# Patient Record
Sex: Male | Born: 1935 | Race: White | Hispanic: No | Marital: Married | Smoking: Never smoker
Health system: Southern US, Community
[De-identification: ages and names within clinical notes are randomized; demographics above are authoritative.]

## PROBLEM LIST (undated history)

## (undated) DIAGNOSIS — E079 Disorder of thyroid, unspecified: Secondary | ICD-10-CM

## (undated) DIAGNOSIS — M1990 Unspecified osteoarthritis, unspecified site: Secondary | ICD-10-CM

## (undated) DIAGNOSIS — C801 Malignant (primary) neoplasm, unspecified: Secondary | ICD-10-CM

## (undated) DIAGNOSIS — F0280 Dementia in other diseases classified elsewhere without behavioral disturbance: Secondary | ICD-10-CM

## (undated) DIAGNOSIS — I10 Essential (primary) hypertension: Secondary | ICD-10-CM

## (undated) DIAGNOSIS — G309 Alzheimer's disease, unspecified: Secondary | ICD-10-CM

## (undated) DIAGNOSIS — C44229 Squamous cell carcinoma of skin of left ear and external auricular canal: Secondary | ICD-10-CM

## (undated) DIAGNOSIS — I4891 Unspecified atrial fibrillation: Secondary | ICD-10-CM

## (undated) DIAGNOSIS — E119 Type 2 diabetes mellitus without complications: Secondary | ICD-10-CM

## (undated) HISTORY — PX: HERNIA REPAIR: SHX51

## (undated) HISTORY — PX: JOINT REPLACEMENT: SHX530

## (undated) HISTORY — PX: BRAIN SURGERY: SHX531

---

## 2017-04-28 ENCOUNTER — Encounter: Payer: Self-pay | Admitting: Emergency Medicine

## 2017-04-28 ENCOUNTER — Emergency Department: Payer: Medicare Other

## 2017-04-28 ENCOUNTER — Observation Stay
Admission: EM | Admit: 2017-04-28 | Discharge: 2017-04-29 | Disposition: A | Discharge status: Home Health | Payer: Medicare Other | Attending: Internal Medicine | Admitting: Internal Medicine

## 2017-04-28 ENCOUNTER — Observation Stay
Admit: 2017-04-28 | Discharge: 2017-04-28 | Disposition: A | Discharge status: Home / Self Care | Payer: Medicare Other | Attending: Internal Medicine | Admitting: Internal Medicine

## 2017-04-28 DIAGNOSIS — N183 Chronic kidney disease, stage 3 (moderate): Secondary | ICD-10-CM | POA: Diagnosis not present

## 2017-04-28 DIAGNOSIS — M545 Low back pain: Secondary | ICD-10-CM | POA: Insufficient documentation

## 2017-04-28 DIAGNOSIS — Z66 Do not resuscitate: Secondary | ICD-10-CM | POA: Insufficient documentation

## 2017-04-28 DIAGNOSIS — I480 Paroxysmal atrial fibrillation: Secondary | ICD-10-CM | POA: Diagnosis not present

## 2017-04-28 DIAGNOSIS — Z79899 Other long term (current) drug therapy: Secondary | ICD-10-CM | POA: Diagnosis not present

## 2017-04-28 DIAGNOSIS — G8929 Other chronic pain: Secondary | ICD-10-CM | POA: Diagnosis not present

## 2017-04-28 DIAGNOSIS — R778 Other specified abnormalities of plasma proteins: Secondary | ICD-10-CM

## 2017-04-28 DIAGNOSIS — M1990 Unspecified osteoarthritis, unspecified site: Secondary | ICD-10-CM | POA: Insufficient documentation

## 2017-04-28 DIAGNOSIS — E119 Type 2 diabetes mellitus without complications: Secondary | ICD-10-CM | POA: Diagnosis not present

## 2017-04-28 DIAGNOSIS — E0590 Thyrotoxicosis, unspecified without thyrotoxic crisis or storm: Secondary | ICD-10-CM | POA: Insufficient documentation

## 2017-04-28 DIAGNOSIS — Z91041 Radiographic dye allergy status: Secondary | ICD-10-CM | POA: Diagnosis not present

## 2017-04-28 DIAGNOSIS — Z7901 Long term (current) use of anticoagulants: Secondary | ICD-10-CM | POA: Diagnosis not present

## 2017-04-28 DIAGNOSIS — E039 Hypothyroidism, unspecified: Secondary | ICD-10-CM | POA: Insufficient documentation

## 2017-04-28 DIAGNOSIS — R531 Weakness: Secondary | ICD-10-CM

## 2017-04-28 DIAGNOSIS — I129 Hypertensive chronic kidney disease with stage 1 through stage 4 chronic kidney disease, or unspecified chronic kidney disease: Secondary | ICD-10-CM | POA: Diagnosis not present

## 2017-04-28 DIAGNOSIS — R7989 Other specified abnormal findings of blood chemistry: Secondary | ICD-10-CM | POA: Diagnosis not present

## 2017-04-28 DIAGNOSIS — M549 Dorsalgia, unspecified: Secondary | ICD-10-CM

## 2017-04-28 DIAGNOSIS — F0281 Dementia in other diseases classified elsewhere with behavioral disturbance: Secondary | ICD-10-CM | POA: Insufficient documentation

## 2017-04-28 DIAGNOSIS — I4891 Unspecified atrial fibrillation: Secondary | ICD-10-CM | POA: Diagnosis present

## 2017-04-28 DIAGNOSIS — Z7983 Long term (current) use of bisphosphonates: Secondary | ICD-10-CM | POA: Insufficient documentation

## 2017-04-28 DIAGNOSIS — D696 Thrombocytopenia, unspecified: Secondary | ICD-10-CM | POA: Diagnosis not present

## 2017-04-28 DIAGNOSIS — Z7984 Long term (current) use of oral hypoglycemic drugs: Secondary | ICD-10-CM | POA: Insufficient documentation

## 2017-04-28 DIAGNOSIS — E860 Dehydration: Secondary | ICD-10-CM | POA: Insufficient documentation

## 2017-04-28 DIAGNOSIS — G309 Alzheimer's disease, unspecified: Secondary | ICD-10-CM | POA: Insufficient documentation

## 2017-04-28 DIAGNOSIS — M6282 Rhabdomyolysis: Secondary | ICD-10-CM | POA: Insufficient documentation

## 2017-04-28 DIAGNOSIS — N179 Acute kidney failure, unspecified: Secondary | ICD-10-CM | POA: Diagnosis not present

## 2017-04-28 DIAGNOSIS — M6281 Muscle weakness (generalized): Secondary | ICD-10-CM | POA: Insufficient documentation

## 2017-04-28 HISTORY — DX: Dementia in other diseases classified elsewhere, unspecified severity, without behavioral disturbance, psychotic disturbance, mood disturbance, and anxiety: F02.80

## 2017-04-28 HISTORY — DX: Unspecified osteoarthritis, unspecified site: M19.90

## 2017-04-28 HISTORY — DX: Squamous cell carcinoma of skin of left ear and external auricular canal: C44.229

## 2017-04-28 HISTORY — DX: Disorder of thyroid, unspecified: E07.9

## 2017-04-28 HISTORY — DX: Alzheimer's disease, unspecified: G30.9

## 2017-04-28 HISTORY — DX: Essential (primary) hypertension: I10

## 2017-04-28 HISTORY — DX: Malignant (primary) neoplasm, unspecified: C80.1

## 2017-04-28 HISTORY — DX: Unspecified atrial fibrillation: I48.91

## 2017-04-28 HISTORY — DX: Type 2 diabetes mellitus without complications: E11.9

## 2017-04-28 LAB — URINALYSIS, COMPLETE (UACMP) WITH MICROSCOPIC
Bacteria, UA: NONE SEEN
Bilirubin Urine: NEGATIVE
Glucose, UA: 50 mg/dL — AB
KETONES UR: 5 mg/dL — AB
Leukocytes, UA: NEGATIVE
Nitrite: NEGATIVE
PH: 5 (ref 5.0–8.0)
Protein, ur: 30 mg/dL — AB
SPECIFIC GRAVITY, URINE: 1.023 (ref 1.005–1.030)
Squamous Epithelial / LPF: NONE SEEN

## 2017-04-28 LAB — COMPREHENSIVE METABOLIC PANEL
ALT: 24 U/L (ref 17–63)
ANION GAP: 8 (ref 5–15)
AST: 46 U/L — ABNORMAL HIGH (ref 15–41)
Albumin: 3.7 g/dL (ref 3.5–5.0)
Alkaline Phosphatase: 76 U/L (ref 38–126)
BUN: 27 mg/dL — ABNORMAL HIGH (ref 6–20)
CALCIUM: 9 mg/dL (ref 8.9–10.3)
CO2: 26 mmol/L (ref 22–32)
Chloride: 101 mmol/L (ref 101–111)
Creatinine, Ser: 1.53 mg/dL — ABNORMAL HIGH (ref 0.61–1.24)
GFR calc non Af Amer: 39 mL/min — ABNORMAL LOW (ref >60)
GFR, EST AFRICAN AMERICAN: 46 mL/min — AB (ref >60)
Glucose, Bld: 143 mg/dL — ABNORMAL HIGH (ref 65–99)
Potassium: 3.7 mmol/L (ref 3.5–5.1)
SODIUM: 135 mmol/L (ref 135–145)
Total Bilirubin: 1.4 mg/dL — ABNORMAL HIGH (ref 0.3–1.2)
Total Protein: 6.7 g/dL (ref 6.5–8.1)

## 2017-04-28 LAB — CBC WITH DIFFERENTIAL/PLATELET
Basophils Absolute: 0 10*3/uL (ref 0–0.1)
Basophils Relative: 0 %
EOS ABS: 0 10*3/uL (ref 0–0.7)
EOS PCT: 0 %
HCT: 38.8 % — ABNORMAL LOW (ref 40.0–52.0)
Hemoglobin: 13.6 g/dL (ref 13.0–18.0)
LYMPHS ABS: 2 10*3/uL (ref 1.0–3.6)
Lymphocytes Relative: 20 %
MCH: 32 pg (ref 26.0–34.0)
MCHC: 35 g/dL (ref 32.0–36.0)
MCV: 91.6 fL (ref 80.0–100.0)
MONO ABS: 1 10*3/uL (ref 0.2–1.0)
MONOS PCT: 10 %
Neutro Abs: 7.2 10*3/uL — ABNORMAL HIGH (ref 1.4–6.5)
Neutrophils Relative %: 70 %
PLATELETS: 141 10*3/uL — AB (ref 150–440)
RBC: 4.24 MIL/uL — ABNORMAL LOW (ref 4.40–5.90)
RDW: 13.8 % (ref 11.5–14.5)
WBC: 10.3 10*3/uL (ref 3.8–10.6)

## 2017-04-28 LAB — MAGNESIUM: Magnesium: 1.6 mg/dL — ABNORMAL LOW (ref 1.7–2.4)

## 2017-04-28 LAB — GLUCOSE, CAPILLARY
GLUCOSE-CAPILLARY: 181 mg/dL — AB (ref 65–99)
GLUCOSE-CAPILLARY: 143 mg/dL — AB (ref 65–99)
Glucose-Capillary: 175 mg/dL — ABNORMAL HIGH (ref 65–99)

## 2017-04-28 LAB — ECHOCARDIOGRAM COMPLETE
Height: 68 in
Weight: 2576 oz

## 2017-04-28 LAB — TSH: TSH: 0.141 u[IU]/mL — ABNORMAL LOW (ref 0.350–4.500)

## 2017-04-28 LAB — TROPONIN I
TROPONIN I: 0.05 ng/mL — AB (ref <0.03)
TROPONIN I: 0.05 ng/mL — AB (ref <0.03)
Troponin I: 0.1 ng/mL (ref <0.03)

## 2017-04-28 LAB — CK: CK TOTAL: 854 U/L — AB (ref 49–397)

## 2017-04-28 MED ORDER — OXYCODONE-ACETAMINOPHEN 5-325 MG PO TABS
1.0000 | ORAL_TABLET | Freq: Once | ORAL | Status: AC
  Administered 2017-04-28: 1 via ORAL

## 2017-04-28 MED ORDER — ENOXAPARIN SODIUM 40 MG/0.4ML ~~LOC~~ SOLN: 40.0000 mg | SUBCUTANEOUS | Status: DC

## 2017-04-28 MED ORDER — TRAMADOL HCL 50 MG PO TABS: 50.0000 mg | ORAL_TABLET | Freq: Four times a day (QID) | ORAL | Status: DC | PRN

## 2017-04-28 MED ORDER — LEVOTHYROXINE SODIUM 88 MCG PO TABS: 88.0000 ug | ORAL_TABLET | Freq: Every day | ORAL | Status: DC

## 2017-04-28 MED ORDER — SODIUM CHLORIDE 0.9 % IV SOLN
Freq: Once | INTRAVENOUS | Status: AC
  Administered 2017-04-28: 11:00:00 via INTRAVENOUS

## 2017-04-28 MED ORDER — POTASSIUM CHLORIDE IN NACL 20-0.9 MEQ/L-% IV SOLN
INTRAVENOUS | Status: AC
  Administered 2017-04-28: 13:00:00 via INTRAVENOUS
  Filled 2017-04-28 (×3): qty 1000

## 2017-04-28 MED ORDER — HALOPERIDOL LACTATE 5 MG/ML IJ SOLN
1.0000 mg | Freq: Four times a day (QID) | INTRAMUSCULAR | Status: DC | PRN
  Administered 2017-04-28: 1 mg via INTRAVENOUS
  Filled 2017-04-28: qty 1

## 2017-04-28 MED ORDER — MAGNESIUM SULFATE 2 GM/50ML IV SOLN
2.0000 g | Freq: Once | INTRAVENOUS | Status: AC
  Administered 2017-04-28: 2 g via INTRAVENOUS
  Filled 2017-04-28: qty 50

## 2017-04-28 MED ORDER — METOPROLOL TARTRATE 5 MG/5ML IV SOLN
5.0000 mg | Freq: Once | INTRAVENOUS | Status: AC
Start: 2017-04-28 — End: 2017-04-28
  Administered 2017-04-28: 5 mg via INTRAVENOUS

## 2017-04-28 MED ORDER — RIVASTIGMINE 13.3 MG/24HR TD PT24: 13.3000 mg | MEDICATED_PATCH | Freq: Every day | TRANSDERMAL | Status: DC

## 2017-04-28 MED ORDER — FINASTERIDE 5 MG PO TABS
5.0000 mg | ORAL_TABLET | Freq: Every day | ORAL | Status: DC
  Administered 2017-04-28 – 2017-04-29 (×2): 5 mg via ORAL
  Filled 2017-04-28 (×2): qty 1

## 2017-04-28 MED ORDER — MAGNESIUM OXIDE 400 (241.3 MG) MG PO TABS
400.0000 mg | ORAL_TABLET | Freq: Every day | ORAL | Status: DC
  Administered 2017-04-29: 400 mg via ORAL
  Filled 2017-04-28: qty 1

## 2017-04-28 MED ORDER — ROSUVASTATIN CALCIUM 10 MG PO TABS
10.0000 mg | ORAL_TABLET | Freq: Every day | ORAL | Status: DC
  Administered 2017-04-28: 10 mg via ORAL
  Filled 2017-04-28 (×2): qty 1

## 2017-04-28 MED ORDER — ONDANSETRON HCL 4 MG/2ML IJ SOLN: 4.0000 mg | Freq: Four times a day (QID) | INTRAMUSCULAR | Status: DC | PRN

## 2017-04-28 MED ORDER — DOCUSATE SODIUM 100 MG PO CAPS
100.0000 mg | ORAL_CAPSULE | Freq: Two times a day (BID) | ORAL | Status: DC
  Administered 2017-04-28 – 2017-04-29 (×2): 100 mg via ORAL
  Filled 2017-04-28 (×2): qty 1

## 2017-04-28 MED ORDER — ALBUTEROL SULFATE (2.5 MG/3ML) 0.083% IN NEBU: 2.5000 mg | INHALATION_SOLUTION | RESPIRATORY_TRACT | Status: DC | PRN

## 2017-04-28 MED ORDER — METOPROLOL TARTRATE 50 MG PO TABS
50.0000 mg | ORAL_TABLET | Freq: Once | ORAL | Status: AC
  Administered 2017-04-28: 50 mg via ORAL

## 2017-04-28 MED ORDER — BUSPIRONE HCL 5 MG PO TABS
5.0000 mg | ORAL_TABLET | Freq: Every evening | ORAL | Status: DC
Start: 2017-04-28 — End: 2017-04-29
  Administered 2017-04-28: 5 mg via ORAL
  Filled 2017-04-28: qty 1

## 2017-04-28 MED ORDER — ACETAMINOPHEN 325 MG PO TABS
650.0000 mg | ORAL_TABLET | Freq: Four times a day (QID) | ORAL | Status: DC | PRN
  Administered 2017-04-28: 650 mg via ORAL
  Filled 2017-04-28: qty 2

## 2017-04-28 MED ORDER — INSULIN ASPART 100 UNIT/ML ~~LOC~~ SOLN
0.0000 [IU] | Freq: Three times a day (TID) | SUBCUTANEOUS | Status: DC
  Administered 2017-04-28 (×2): 2 [IU] via SUBCUTANEOUS
  Filled 2017-04-28 (×2): qty 1

## 2017-04-28 MED ORDER — ONDANSETRON HCL 4 MG PO TABS: 4.0000 mg | ORAL_TABLET | Freq: Four times a day (QID) | ORAL | Status: DC | PRN

## 2017-04-28 MED ORDER — APIXABAN 5 MG PO TABS
5.0000 mg | ORAL_TABLET | Freq: Two times a day (BID) | ORAL | Status: DC
  Administered 2017-04-28 – 2017-04-29 (×2): 5 mg via ORAL
  Filled 2017-04-28 (×2): qty 1

## 2017-04-28 MED ORDER — ACETAMINOPHEN 650 MG RE SUPP: 650.0000 mg | Freq: Four times a day (QID) | RECTAL | Status: DC | PRN

## 2017-04-28 MED ORDER — ASPIRIN EC 81 MG PO TBEC
81.0000 mg | DELAYED_RELEASE_TABLET | Freq: Every day | ORAL | Status: DC
  Administered 2017-04-28 – 2017-04-29 (×2): 81 mg via ORAL
  Filled 2017-04-28 (×2): qty 1

## 2017-04-28 MED ORDER — METOPROLOL TARTRATE 50 MG PO TABS
50.0000 mg | ORAL_TABLET | Freq: Once | ORAL | Status: AC
  Administered 2017-04-28: 50 mg via ORAL
  Filled 2017-04-28: qty 1

## 2017-04-28 MED ORDER — SODIUM CHLORIDE 0.9% FLUSH
3.0000 mL | Freq: Two times a day (BID) | INTRAVENOUS | Status: DC
  Administered 2017-04-28: 3 mL via INTRAVENOUS

## 2017-04-28 MED ORDER — POLYETHYLENE GLYCOL 3350 17 G PO PACK: 17.0000 g | PACK | Freq: Every day | ORAL | Status: DC | PRN

## 2017-04-28 MED ORDER — PANTOPRAZOLE SODIUM 40 MG PO TBEC
40.0000 mg | DELAYED_RELEASE_TABLET | ORAL | Status: DC
  Administered 2017-04-28: 40 mg via ORAL
  Filled 2017-04-28 (×2): qty 1

## 2017-04-28 MED ORDER — TAMSULOSIN HCL 0.4 MG PO CAPS
0.4000 mg | ORAL_CAPSULE | Freq: Every day | ORAL | Status: DC
  Administered 2017-04-29: 0.4 mg via ORAL
  Filled 2017-04-28: qty 1

## 2017-04-28 MED ORDER — METOPROLOL TARTRATE 50 MG PO TABS
100.0000 mg | ORAL_TABLET | Freq: Two times a day (BID) | ORAL | Status: DC
  Administered 2017-04-28 – 2017-04-29 (×2): 100 mg via ORAL
  Filled 2017-04-28 (×2): qty 2

## 2017-04-28 MED ORDER — APIXABAN 5 MG PO TABS: 5.0000 mg | ORAL_TABLET | Freq: Once | ORAL | Status: DC

## 2017-04-28 NOTE — Progress Notes (Signed)
Sent Dr. Marcille Blanco a text page to notify of increase of troponin from 0.05 to 0.10.

## 2017-04-28 NOTE — H&P (Addendum)
[facility] [place] NAME: Zachary Serrano    MR#:  [id number]  DATE OF BIRTH:  05-03-1928  DATE OF ADMISSION:  04/28/2017  PRIMARY CARE PHYSICIAN: Kirk Ruths, MD   REQUESTING/REFERRING PHYSICIAN: Dr. Jimmye Norman  CHIEF COMPLAINT:   Chief Complaint  Patient presents with  . Back Pain    HISTORY OF PRESENT ILLNESS:  Zachary Serrano  is a 83 y.o. male with a known history of Hypothyroidism, atrial fibrillation, hypertension, Alzheimer's presents to the emergency room brought in by his wife and son from independent living facility due to weakness and unable to walk. Patient at baseline ambulates independently with a walker. Does not need help getting off from bed or chair. Today patient went to the toilet and sat down. Could not get up felt extremely weak and was brought to the ER. Here he was found to have atrial fibrillation with rapid ventricular rate into the 120s. Was given 1 dose of IV Lopressor and he converted to normal sinus rhythm with heart rate in the 80s. He does take metoprolol at home which she did not take in the morning. Patient has Alzheimer's disease diagnosed 5 years back which seems to be progressively worsening. Has had poor appetite for the past 2 or 3 days according to the wife. History obtained from wife and son at bedside. Overall patient is a poor historian and denies any complaints.  He is a [place] patient. Does follow with Dr. Ouida Sills locally here.  PAST MEDICAL HISTORY:   Past Medical History:  Diagnosis Date  . A-fib ([place])   . Alzheimer disease   . Arthritis   . Cancer ([place])    skin  . Diabetes mellitus without complication ([place])   . Hypertension   . 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 Thyroid disease     PAST SURGICAL HISTORY:   Past Surgical History:  Procedure Laterality Date  . BRAIN SURGERY    . HERNIA REPAIR    . JOINT REPLACEMENT      SOCIAL HISTORY:    Social History  Substance Use Topics  . Smoking status: Never Smoker  . Smokeless tobacco: Never Used  . Alcohol use No    FAMILY HISTORY:   Family History  Problem Relation Age of Onset  . Colon cancer Neg Hx     DRUG ALLERGIES:   Allergies  Allergen Reactions  . Contrast Media [Iodinated Diagnostic Agents] Other (See Comments)    Dizziness.  . Diphenhydramine     dizzy  . Fenofibrate Other (See Comments)    Anorexia, Fatigue.  Marland Kitchen Lisinopril     cough    REVIEW OF SYSTEMS:   Review of Systems  Constitutional: Positive for malaise/fatigue. Negative for chills, fever and weight loss.  HENT: Negative for hearing loss and nosebleeds.   Eyes: Negative for blurred vision, double vision and pain.  Respiratory: Negative for cough, hemoptysis, sputum production, shortness of breath and wheezing.   Cardiovascular: Negative for chest pain, palpitations, orthopnea and leg swelling.  Gastrointestinal: Negative for abdominal pain, constipation, diarrhea, nausea and vomiting.  Genitourinary: Negative for dysuria and hematuria.  Musculoskeletal: Negative for back pain, falls and myalgias.  Skin: Negative for rash.  Neurological: Positive for weakness. Negative for dizziness, tremors, sensory change, speech change, focal weakness, seizures and headaches.  Endo/Heme/Allergies: Does not bruise/bleed easily.  Psychiatric/Behavioral: Negative for depression and memory loss. The patient is not nervous/anxious.  MEDICATIONS AT HOME:   Prior to Admission medications   Medication Sig Start Date End Date Taking? Authorizing Provider  acetaminophen (TYLENOL) 500 MG tablet Take 1,000 mg by mouth daily as needed.   Yes [provider]  amLODipine (NORVASC) 5 MG tablet Take 5 mg by mouth daily.   Yes [provider]  apixaban (ELIQUIS) 5 MG TABS tablet Take 5 mg by mouth 2 (two) times daily.   Yes [provider]  busPIRone (BUSPAR) 5 MG tablet Take 5 mg by  mouth every evening.   Yes [provider]  Cholecalciferol ([employer] D3) 2000 units CAPS Take 2,000 Units by mouth daily.   Yes [provider]  finasteride (PROSCAR) 5 MG tablet Take 5 mg by mouth daily.   Yes [provider]  ipratropium (ATROVENT) 0.03 % nasal spray Place 2 sprays into both nostrils 3 (three) times daily as needed for rhinitis.   Yes [provider]  ketoconazole (NIZORAL) 2 % cream Apply 1 application topically daily as needed for irritation (Rash on leg).   Yes [provider]  levothyroxine (SYNTHROID, LEVOTHROID) 88 MCG tablet Take 88 mcg by mouth daily before breakfast.   Yes [provider]  MELATONIN PO Take 1 tablet by mouth at bedtime as needed (SLEEP).   Yes [provider]  metFORMIN (GLUCOPHAGE) 500 MG tablet Take 250 mg by mouth 2 (two) times daily with a meal.   Yes [provider]  metoprolol tartrate (LOPRESSOR) 50 MG tablet Take 75 mg by mouth 2 (two) times daily.   Yes [provider]  Multiple Vitamin (MULTIVITAMIN) tablet Take 1 tablet by mouth daily.   Yes [provider]  pantoprazole (PROTONIX) 40 MG tablet Take 40 mg by mouth every other day.   Yes [provider]  predniSONE (DELTASONE) 5 MG tablet Take 5 mg by mouth daily as needed.   Yes [provider]  Rivastigmine 13.3 MG/24HR PT24 Place 13.3 mg onto the skin daily.   Yes [provider]  rosuvastatin (CRESTOR) 10 MG tablet Take 10 mg by mouth daily.   Yes [provider]  tamsulosin (FLOMAX) 0.4 MG CAPS capsule Take 0.4 mg by mouth daily.   Yes [provider]  testosterone cypionate (DEPOTESTOSTERONE CYPIONATE) 200 MG/ML injection Inject 200 mg into the muscle every 28 (twenty-eight) days.   Yes [provider]     VITAL SIGNS:  Blood pressure 139/80, pulse (!) 121, temperature 98.3 F (36.8 C), temperature source Oral, resp. rate 15, height 5\' 8"  (1.727 m),  weight 73 kg (161 lb), SpO2 95 %.  PHYSICAL EXAMINATION:  Physical Exam  GENERAL:  83 y.o.-year-old patient lying in the bed with no acute distress.  EYES: Pupils equal, round, reactive to light and accommodation. No scleral icterus. Extraocular muscles intact.  HEENT: Head atraumatic, normocephalic. Oropharynx and nasopharynx clear. No oropharyngeal erythema, moist oral mucosa  NECK:  Supple, no jugular venous distention. No thyroid enlargement, no tenderness.  LUNGS: Normal breath sounds bilaterally, no wheezing, rales, rhonchi. No use of accessory muscles of respiration.  CARDIOVASCULAR: S1, S2 normal. No murmurs, rubs, or gallops.  ABDOMEN: Soft, nontender, nondistended. Bowel sounds present. No organomegaly or mass.  EXTREMITIES: No pedal edema, cyanosis, or clubbing. + 2 pedal & radial pulses b/l.   NEUROLOGIC: Cranial nerves II through XII are intact. No focal Motor or sensory deficits appreciated b/l PSYCHIATRIC: The patient is alert and awake. Pleasant. SKIN: Left ear skin lesion from recent biopsy  LABORATORY PANEL:   CBC  Recent Labs Lab 04/28/17 0831  WBC 10.3  HGB 13.6  HCT 38.8*  PLT 141*   ------------------------------------------------------------------------------------------------------------------  Chemistries   Recent Labs Lab 04/28/17 0831  NA 135  K 3.7  CL 101  CO2 26  GLUCOSE 143*  BUN 27*  CREATININE 1.53*  CALCIUM 9.0  AST 46*  ALT 24  ALKPHOS 76  BILITOT 1.4*   ------------------------------------------------------------------------------------------------------------------  Cardiac Enzymes  Recent Labs Lab 04/28/17 0831  TROPONINI 0.05*   ------------------------------------------------------------------------------------------------------------------  RADIOLOGY:  Dg Lumbar Spine 2-3 Views  Result Date: 04/28/2017 CLINICAL DATA:  83 year old male with chronic lumbar back pain, prior surgery. Continued pain. EXAM: LUMBAR SPINE  - 2-3 VIEW COMPARISON:  None. FINDINGS: Sequelae of lower lumbar and sacral fusion. Lumbar segmentation appears normal, with previous posterior and interbody fusion at L4-L5 and L5-S1. Transpedicular hardware remains in place. Severe disc space loss with vacuum disc and mild retrolisthesis of L3 on L4. Moderate facet hypertrophy at that level. A degree of underlying osteopenia. No acute osseous abnormality identified. Extensive Calcified aortic atherosclerosis. IMPRESSION: 1. Prior L4-L5 and L5-S1 fusion with solid appearing arthrodesis. 2. 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 3. Extensive Calcified aortic atherosclerosis. Electronically Signed   By: Genevie Ann M.D.   On: 04/28/2017 08:34     IMPRESSION AND PLAN:   * Afib with RVR Improved with Metoprolol and in normal sinus rhythm. Has mildly elevated troponin. Will admit under observation. Check echocardiogram. Repeat troponin. Telemetry monitoring. ON eiliquis.  * Fatigue. Likely due to A. fib with RVR and possibly mild dehydration. We'll have physical therapy evaluate for discharge needs.  * Acute kidney injury versus CKD stage III. Start IV fluids. Repeat labs in the morning. He does seem to be mildly dehydrated per  * Hypothyroidism. Continue levothyroxine. Check TSH.  * Diabetes mellitus. Sliding scale insulin.  * DVT prophylaxis  ON eliquis  All the records are reviewed and case discussed with ED provider. Management plans discussed with the patient, family and they are in agreement.  CODE STATUS: FULL CODE  TOTAL TIME TAKING CARE OF THIS PATIENT: 40 minutes.   Hillary Bow R M.D on 04/28/2017 at 11:03 AM  Between 7am to 6pm - Pager - [phone]  After 6pm go to [email or web address] - password [email or web address] [facility]  Office  [phone]  CC: Primary care physician; Kirk Ruths, MD  Note: This dictation was prepared with Dragon dictation along with  smaller phrase technology. Any transcriptional errors that result from this process are unintentional.

## 2017-04-28 NOTE — Clinical Social Work Note (Signed)
CSW received referral for SNF.  Patient was seen by PT and they are recommending home health.  CSW to monitor patient in case his needs change and requires SNF placement.  Jones Broom. [place], MSW, [place]

## 2017-04-28 NOTE — Progress Notes (Signed)
MD notified of low magnesium. I will continue to assess.

## 2017-04-28 NOTE — ED Provider Notes (Signed)
[facility] Emergency Department Provider Note       Time seen: ----------------------------------------- 7:57 AM on 04/28/2017 -----------------------------------------     I have reviewed the triage vital signs and the nursing notes.   HISTORY   Chief Complaint Back Pain    HPI Zachary Serrano is a 83 y.o. male who presents to the ED for low back pain this morning and inability to walk. Patient does have chronic low back pain, has had a history of back surgery with rod placement years ago. His only known medical problems or chronic back pain and atrial fibrillation. He has not had his medications at this morning. Family reports he went to use the bathroom and could not get off the toilet. Since that period time he has not been able to walk, does have a history of dementia.   Past Medical History:  Diagnosis Date  . Arthritis   . Cancer ([place])    skin  . Diabetes mellitus without complication ([place])   . Hypertension   . Thyroid disease     There are no active problems to display for this patient.   Past Surgical History:  Procedure Laterality Date  . BRAIN SURGERY    . HERNIA REPAIR    . JOINT REPLACEMENT      Allergies Patient has no known allergies.  Social History Social History  Substance Use Topics  . Smoking status: Never Smoker  . Smokeless tobacco: Never Used  . Alcohol use No    Review of Systems Constitutional: Negative for fever. Cardiovascular: Negative for chest pain. Respiratory: Negative for shortness of breath. Gastrointestinal: Negative for abdominal pain, vomiting and diarrhea. Genitourinary: Negative for dysuria. Musculoskeletal: Positive for back pain Skin: Negative for rash. Neurological: Negative for headaches, focal weakness or numbness.  All systems negative/normal/unremarkable except as stated in the HPI  ____________________________________________   PHYSICAL EXAM:  VITAL SIGNS: ED Triage Vitals   Enc Vitals Group     BP 04/28/17 0746 139/80     Pulse Rate 04/28/17 0746 (!) 121     Resp 04/28/17 0746 15     Temp 04/28/17 0746 98.3 F (36.8 C)     Temp Source 04/28/17 0746 Oral     SpO2 04/28/17 0746 95 %     Weight 04/28/17 0747 161 lb (73 kg)     Height 04/28/17 0747 5\' 8"  (1.727 m)     Head Circumference --      Peak Flow --      Pain Score --      Pain Loc --      Pain Edu? --      Excl. in [place]? --     Constitutional: Alert,  Well appearing and in no distress. Eyes: Conjunctivae are normal. Normal extraocular movements. ENT   Head: Normocephalic and atraumatic.   Nose: No congestion/rhinnorhea.   Mouth/Throat: Mucous membranes are moist.   Neck: No stridor. Cardiovascular: Normal rate, regular rhythm. No murmurs, rubs, or gallops. Respiratory: Normal respiratory effort without tachypnea nor retractions. Breath sounds are clear and equal bilaterally. No wheezes/rales/rhonchi. Gastrointestinal: Soft and nontender. Normal bowel sounds Musculoskeletal: Nontender with normal range of motion in extremities. No lower extremity tenderness nor edema. Adequate strength in lower extremities Neurologic:  Normal speech and language. No gross focal neurologic deficits are appreciated.  Skin:  Skin is warm, dry and intact. No rash noted. Psychiatric: Mood and affect are normal. Speech and behavior are normal.  ____________________________________________  EKG: Interpreted by me. H  for ablation with a rapid ventricular response, rate is 139 bpm, normal QRS, long QT, possible septal infarct age indeterminate  ____________________________________________  ED COURSE:  Pertinent labs & imaging results that were available during my care of the patient were reviewed by me and considered in my medical decision making (see chart for details). Patient presents for weakness, we will assess with labs and imaging as indicated.    Procedures ____________________________________________   LABS (pertinent positives/negatives)  Labs Reviewed  CBC WITH DIFFERENTIAL/PLATELET - Abnormal; Notable for the following:       Result Value   RBC 4.24 (*)    HCT 38.8 (*)    Platelets 141 (*)    Neutro Abs 7.2 (*)    All other components within normal limits  COMPREHENSIVE METABOLIC PANEL - Abnormal; Notable for the following:    Glucose, Bld 143 (*)    BUN 27 (*)    Creatinine, Ser 1.53 (*)    AST 46 (*)    Total Bilirubin 1.4 (*)    GFR calc non Af Amer 39 (*)    GFR calc Af Amer 46 (*)    All other components within normal limits  TROPONIN I - Abnormal; Notable for the following:    Troponin I 0.05 (*)    All other components within normal limits  URINALYSIS, COMPLETE (UACMP) WITH MICROSCOPIC - Abnormal; Notable for the following:    Color, Urine AMBER (*)    APPearance CLEAR (*)    Glucose, UA 50 (*)    Hgb urine dipstick SMALL (*)    Ketones, ur 5 (*)    Protein, ur 30 (*)    All other components within normal limits  CBG MONITORING, ED    RADIOLOGY  Lumbar spine x-rays IMPRESSION: 1. Prior L4-L5 and L5-S1 fusion with solid appearing arthrodesis. 2. 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 3. Extensive Calcified aortic atherosclerosis.  ____________________________________________  FINAL ASSESSMENT AND PLAN  Weakness, atrial fibrillation, elevated troponin  Plan: Patient's labs and imaging were dictated above. Patient had presented for weakness, found to be in rapid atrial fibrillation. Patient was given IV and oral metoprolol for rate control. Currently he is back in a sinus rhythm. Family states he is so weak that he cannot walk, he would benefit from observation in the hospital, repeat troponin and PT consult with social work.   Earleen Newport, MD   Note: This note was generated in part or whole with voice recognition software. Voice  recognition is usually quite accurate but there are transcription errors that can and very often do occur. I apologize for any typographical errors that were not detected and corrected.     Earleen Newport, MD 04/28/17 1019

## 2017-04-28 NOTE — Plan of Care (Signed)
Problem: Safety: Goal: Ability to remain free from injury will improve Outcome: Progressing Pt placed on high fall risk secondary to history of dementia. Exit alarm activated and pt placed in front of nurses station to help decrease risk of fall.

## 2017-04-28 NOTE — Progress Notes (Signed)
Pts  Family leave around 29.  RN enter to give bedside report 2 min. Later and  Pt found in the floor unwitnessed fall. VSS, no complaints of pain, assessed for injury none noted. Pt was sitting beside bed with legs folded beside him. MD notified, family notified.

## 2017-04-28 NOTE — Care Management Obs Status (Signed)
[place] NOTIFICATION   Patient Details  Name: Zachary Serrano MRN: [id number] Date of Birth: 04/07/1928   Medicare Observation Status Notification Given:  Yes    Katrina Stack, RN 04/28/2017, 2:54 PM

## 2017-04-28 NOTE — ED Notes (Signed)
Coffee given to wife

## 2017-04-28 NOTE — Progress Notes (Signed)
MD notified of patient fall. MD orders haldol prn. I will continue to assess.

## 2017-04-28 NOTE — Evaluation (Signed)
Physical Therapy Evaluation Patient Details Name: Zachary Serrano MRN: [id number] DOB: [birth year] Today's Date: 04/28/2017   History of Present Illness  83 y.o. male with a known history of Hypothyroidism, atrial fibrillation, hypertension, Alzheimer's presents to the emergency room brought in by his wife and son from independent living facility due to weakness and unable to walk.   Clinical Impression  Pt is able to rise to sitting w/o direct assist though he does need heavy UE use and some momentum.  Overall he did well with good confidence during ambulation and used FWW well, though apparently he walks significantly faster and smoother with 4WW at baseline.  Pt showed functional strength and should be able to return home with HHPT.     Follow Up Recommendations Home health PT    Equipment Recommendations       Recommendations for Other Services       Precautions / Restrictions Precautions Precautions: Fall Restrictions Weight Bearing Restrictions: No      Mobility  Bed Mobility Overal bed mobility: Modified Independent             General bed mobility comments: Pt struggled to get himself to EOB, but ultimately needed only CGA  Transfers Overall transfer level: Modified independent Equipment used: Rolling walker (2 wheeled)             General transfer comment: pt needed reminders for hand placement and general safety issues, but ultimately rose to standing without direct assist  Ambulation/Gait Ambulation/Gait assistance: Supervision Ambulation Distance (Feet): 200 Feet Assistive device: Rolling walker (2 wheeled)       General Gait Details: Pt showed good confidence and consistent cadence with the effort of ambulation.  He was not at his baseline (speed, confidence) but ultimately is safe for in-home distances.  Pt's O2 remained in the 90s and HR stayed <110 with the effort of prolonged ambulation.   Stairs            Wheelchair Mobility     Modified Rankin (Stroke Patients Only)       Balance Overall balance assessment: Modified Independent                                           Pertinent Vitals/Pain Pain Assessment: No/denies pain    Home Living Family/patient expects to be discharged to:: Private residence Living Arrangements: Spouse/significant other Available Help at Discharge:  (son lives close, highly involved)   Home Access: Level entry       Home Equipment: Walker - 4 wheels      Prior Function Level of Independence: Independent with assistive device(s)               Hand Dominance        Extremity/Trunk Assessment   Upper Extremity Assessment Upper Extremity Assessment: Generalized weakness;Overall [facility] for tasks assessed    Lower Extremity Assessment Lower Extremity Assessment: Generalized weakness;Overall WFL for tasks assessed       Communication   Communication: No difficulties  Cognition Arousal/Alertness: Awake/alert Behavior During Therapy: WFL for tasks assessed/performed Overall Cognitive Status: History of cognitive impairments - at baseline (son reports he is back to his baseline)  General Comments      Exercises     Assessment/Plan    PT Assessment Patient needs continued PT services  PT Problem List         PT Treatment Interventions DME instruction;Gait training;Functional mobility training;Therapeutic activities;Therapeutic exercise;Balance training;Neuromuscular re-education;Patient/family education    PT Goals (Current goals can be found in the Care Plan section)  Acute Rehab PT Goals Patient Stated Goal: go home PT Goal Formulation: With patient Time For Goal Achievement: 05/12/17 Potential to Achieve Goals: Good    Frequency Min 2X/week   Barriers to discharge        Co-evaluation               AM-PAC PT "6 Clicks" Daily Activity  Outcome Measure  Difficulty turning over in bed (including adjusting bedclothes, sheets and blankets)?: A Little Difficulty moving from lying on back to sitting on the side of the bed? : A Little Difficulty sitting down on and standing up from a chair with arms (e.g., wheelchair, bedside commode, etc,.)?: A Little Help needed moving to and from a bed to chair (including a wheelchair)?: None Help needed walking in hospital room?: None Help needed climbing 3-5 steps with a railing? : A Little 6 Click Score: 20    End of Session Equipment Utilized During Treatment: Gait belt Activity Tolerance: Patient tolerated treatment well Patient left: with chair alarm set;with call bell/phone within reach   PT Visit Diagnosis: Difficulty in walking, not elsewhere classified (R26.2);Muscle weakness (generalized) (M62.81)    Time: 1345-1410 PT Time Calculation (min) (ACUTE ONLY): 25 min   Charges:   PT Evaluation $PT Eval Low Complexity: 1 Procedure     PT G Codes:   PT G-Codes **NOT FOR INPATIENT CLASS** Functional Assessment Tool Used: AM-PAC 6 Clicks Basic Mobility Functional Limitation: Mobility: Walking and moving around Mobility: Walking and Moving Around Current Status (Q2863): At least 20 percent but less than 40 percent impaired, limited or restricted Mobility: Walking and Moving Around Goal Status [phone]): At least 1 percent but less than 20 percent impaired, limited or restricted    Kreg Shropshire, DPT 04/28/2017, 3:34 PM

## 2017-04-28 NOTE — Consult Note (Signed)
[facility]  CARDIOLOGY CONSULT NOTE  Patient ID: Zachary Serrano MRN: [id number] DOB/AGE: 12/12/1927 83 y.o.  Admit date: 04/28/2017 Referring Physician Dr. Darvin Neighbours Primary Physician   Primary Cardiologist   Reason for Consultation afib  HPI: Pt is a 83 yo male with history of dementia and history of previous afib in the past who was admitted after developing increasing weakness and fatigue. He had similar symptoms when he has been in afib in the past. He most recently had afib aproximately 5 years ago during an episode of pna. He was seen in the her and has converted back to nsr with iv metoprolol. He is anticoagulated with apixiban 5 mg bid and on metoprolol 75 mg bid . His symptoms have improved with conversion to nsr. He has ruled ot for an mi and denies current symptoms.  Review of Systems  HENT: Negative.   Eyes: Negative.   Respiratory: Negative.   Cardiovascular: Negative.   Gastrointestinal: Negative.   Genitourinary: Negative.   Musculoskeletal: Negative.   Skin: Negative.   Neurological: Positive for weakness.  Endo/Heme/Allergies: Negative.   Psychiatric/Behavioral: Positive for memory loss.    Past Medical History:  Diagnosis Date  . A-fib ([place])   . Alzheimer disease   . Arthritis   . Cancer ([place])    skin  . Diabetes mellitus without complication ([place])   . Hypertension   . 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 Thyroid disease     Family History  Problem Relation Age of Onset  . Colon cancer Neg Hx     Social History   Social History  . Marital status: Married    Spouse name: N/A  . Number of children: N/A  . Years of education: N/A   Occupational History  . Not on file.   Social History Main Topics  . Smoking status: Never Smoker  . Smokeless tobacco: Never Used  . Alcohol use No  . Drug use: No  . Sexual activity: Not on file   Other Topics Concern  . Not on  file   Social History Narrative  . No narrative on file    Past Surgical History:  Procedure Laterality Date  . BRAIN SURGERY    . HERNIA REPAIR    . JOINT REPLACEMENT       Prescriptions Prior to Admission  Medication Sig Dispense Refill Last Dose  . acetaminophen (TYLENOL) 500 MG tablet Take 1,000 mg by mouth daily as needed.   PRN at PRN  . amLODipine (NORVASC) 5 MG tablet Take 5 mg by mouth daily.   04/28/2017 at 0800  . apixaban (ELIQUIS) 5 MG TABS tablet Take 5 mg by mouth 2 (two) times daily.   04/28/2017 at 0800  . busPIRone (BUSPAR) 5 MG tablet Take 5 mg by mouth every evening.   04/27/2017 at 2000  . Cholecalciferol ([employer] D3) 2000 units CAPS Take 2,000 Units by mouth daily.   04/28/2017 at 0800  . finasteride (PROSCAR) 5 MG tablet Take 5 mg by mouth daily.   04/27/2017 at 2000  . ipratropium (ATROVENT) 0.03 % nasal spray Place 2 sprays into both nostrils 3 (three) times daily as needed for rhinitis.   PRN at PRN  . ketoconazole (NIZORAL) 2 % cream Apply 1 application topically daily as needed for irritation (Rash on leg).   PRN at PRN  . levothyroxine (SYNTHROID, LEVOTHROID) 88 MCG tablet Take 88 mcg  by mouth daily before breakfast.   04/28/2017 at 0800  . MELATONIN PO Take 1 tablet by mouth at bedtime as needed (SLEEP).   PRN at PRN  . metFORMIN (GLUCOPHAGE) 500 MG tablet Take 250 mg by mouth 2 (two) times daily with a meal.   04/28/2017 at 0800  . metoprolol tartrate (LOPRESSOR) 50 MG tablet Take 75 mg by mouth 2 (two) times daily.   04/28/2017 at 0800  . Multiple Vitamin (MULTIVITAMIN) tablet Take 1 tablet by mouth daily.   04/28/2017 at 0800  . pantoprazole (PROTONIX) 40 MG tablet Take 40 mg by mouth every other day.   Past Week at Unknown time  . predniSONE (DELTASONE) 5 MG tablet Take 5 mg by mouth daily as needed.   PRN at PRN  . Rivastigmine 13.3 MG/24HR PT24 Place 13.3 mg onto the skin daily.   Past Week at Unknown time  . rosuvastatin (CRESTOR) 10 MG tablet Take 10 mg by mouth  daily.   04/27/2017 at 2000  . tamsulosin (FLOMAX) 0.4 MG CAPS capsule Take 0.4 mg by mouth daily.   04/28/2017 at 0800  . testosterone cypionate (DEPOTESTOSTERONE CYPIONATE) 200 MG/ML injection Inject 200 mg into the muscle every 28 (twenty-eight) days.   Past Month at Unknown time    Physical Exam: Blood pressure 101/67, pulse (!) 103, temperature 98.2 F (36.8 C), resp. rate 18, height 5\' 11"  (1.803 m), weight 70.1 kg (154 lb 9.6 oz), SpO2 93 %.   Wt Readings from Last 1 Encounters:  04/28/17 70.1 kg (154 lb 9.6 oz)     General appearance: alert and cooperative Resp: clear to auscultation bilaterally Cardio: regular rate and rhythm GI: soft, non-tender; bowel sounds normal; no masses,  no organomegaly Extremities: extremities normal, atraumatic, no cyanosis or edema Neurologic: Grossly normal  Labs:   Lab Results  Component Value Date   WBC 10.3 04/28/2017   HGB 13.6 04/28/2017   HCT 38.8 (L) 04/28/2017   MCV 91.6 04/28/2017   PLT 141 (L) 04/28/2017    Recent Labs Lab 04/28/17 0831  NA 135  K 3.7  CL 101  CO2 26  BUN 27*  CREATININE 1.53*  CALCIUM 9.0  PROT 6.7  BILITOT 1.4*  ALKPHOS 76  ALT 24  AST 46*  GLUCOSE 143*   Lab Results  Component Value Date   CKTOTAL 854 (H) 04/28/2017   TROPONINI 0.05 (HH) 04/28/2017       EKG: afib converted to nsr.  ASSESSMENT AND PLAN:  Pt with history of afib in the past that was symptomatic who has been anticoagulated with eliquis 5 bid and rate controlled with metoprolol 75 mg bid. Will continue with eliquis at current dose and ok to try and increase the metoprolol to 100 bid. Consider discharge in am istable.  Signed: Teodoro Spray MD, [facility] 04/28/2017, 7:18 PM

## 2017-04-28 NOTE — ED Triage Notes (Addendum)
Pt to ed  Via ems from home independent living facility [facility] with reports of low back pain this am. Pt with hx of same and ems reports rods in back. Pt only known hx is chronic back pain and afib, DM. Ems reports all VSS. Pt only alert to self and place. NAD.  Wife reports pt did not take his meds this am and does have dementia.

## 2017-04-28 NOTE — Progress Notes (Signed)
*  PRELIMINARY RESULTS* Echocardiogram 2D Echocardiogram has been performed.  Zachary Serrano 04/28/2017, 2:22 PM

## 2017-04-29 LAB — URINE CULTURE
CULTURE: NO GROWTH
Special Requests: NORMAL

## 2017-04-29 LAB — CK: Total CK: 544 U/L — ABNORMAL HIGH (ref 49–397)

## 2017-04-29 LAB — CBC
HCT: 37.7 % — ABNORMAL LOW (ref 40.0–52.0)
HEMOGLOBIN: 13 g/dL (ref 13.0–18.0)
MCH: 31.6 pg (ref 26.0–34.0)
MCHC: 34.3 g/dL (ref 32.0–36.0)
MCV: 91.9 fL (ref 80.0–100.0)
PLATELETS: 134 10*3/uL — AB (ref 150–440)
RBC: 4.11 MIL/uL — ABNORMAL LOW (ref 4.40–5.90)
RDW: 14.1 % (ref 11.5–14.5)
WBC: 6.8 10*3/uL (ref 3.8–10.6)

## 2017-04-29 LAB — GLUCOSE, CAPILLARY
GLUCOSE-CAPILLARY: 165 mg/dL — AB (ref 65–99)
Glucose-Capillary: 116 mg/dL — ABNORMAL HIGH (ref 65–99)

## 2017-04-29 LAB — BASIC METABOLIC PANEL
ANION GAP: 5 (ref 5–15)
BUN: 20 mg/dL (ref 6–20)
CALCIUM: 8.4 mg/dL — AB (ref 8.9–10.3)
CO2: 27 mmol/L (ref 22–32)
CREATININE: 1.04 mg/dL (ref 0.61–1.24)
Chloride: 106 mmol/L (ref 101–111)
GFR calc Af Amer: >60 mL/min (ref >60)
Glucose, Bld: 136 mg/dL — ABNORMAL HIGH (ref 65–99)
Potassium: 3.7 mmol/L (ref 3.5–5.1)
Sodium: 138 mmol/L (ref 135–145)

## 2017-04-29 LAB — HEMOGLOBIN A1C
Hgb A1c MFr Bld: 7.7 % — ABNORMAL HIGH (ref 4.8–5.6)
Mean Plasma Glucose: 174 mg/dL

## 2017-04-29 MED ORDER — LEVOTHYROXINE SODIUM 50 MCG PO TABS: 50.0000 ug | ORAL_TABLET | Freq: Every day | ORAL | 0 refills | Status: DC

## 2017-04-29 MED ORDER — MAGNESIUM OXIDE 400 (241.3 MG) MG PO TABS: 400.0000 mg | ORAL_TABLET | Freq: Every day | ORAL | 0 refills | Status: AC

## 2017-04-29 MED ORDER — LEVOTHYROXINE SODIUM 50 MCG PO TABS
50.0000 ug | ORAL_TABLET | Freq: Every day | ORAL | Status: DC
  Administered 2017-04-29: 50 ug via ORAL
  Filled 2017-04-29: qty 1

## 2017-04-29 MED ORDER — QUETIAPINE FUMARATE 25 MG PO TABS: 12.5000 mg | ORAL_TABLET | Freq: Every day | ORAL | 0 refills | Status: AC

## 2017-04-29 MED ORDER — QUETIAPINE FUMARATE 25 MG PO TABS
12.5000 mg | ORAL_TABLET | Freq: Every day | ORAL | Status: DC
Start: 2017-04-29 — End: 2017-04-29

## 2017-04-29 MED ORDER — METOPROLOL TARTRATE 100 MG PO TABS: 100.0000 mg | ORAL_TABLET | Freq: Two times a day (BID) | ORAL | 0 refills | Status: AC

## 2017-04-29 NOTE — Care Management (Signed)
Patient placed in observation for atrial fib.  Resident of [facility] .  Have set up home health nurse, physical therapy and aide with [facility] as this agency follows patient's at [facility].

## 2017-04-29 NOTE — Progress Notes (Signed)
A&O with periods of confusion today.  No respiratory distress with o2 sats in 90's on room air even after ambulating with PT.  Tolerated diet. Discharge instructions given to and reviewed with patient's wife, daughter and son.  Prescriptions given to patient's daughter and changes in medications discussed with family.  Family verbalized understanding of all discharge instructions.  Patient left 2A by wheelchair with volunteer and family with no distress noted.

## 2017-04-29 NOTE — Discharge Summary (Signed)
[place] at [place] NAME: Zachary Serrano    MR#:  [id number]  DATE OF BIRTH:  1928-10-18  DATE OF ADMISSION:  04/28/2017 ADMITTING PHYSICIAN: Hillary Bow, MD  DATE OF DISCHARGE: 04/29/2017  1:27 PM  PRIMARY CARE PHYSICIAN: Kirk Ruths, MD    ADMISSION DIAGNOSIS:  Paroxysmal atrial fibrillation ([place]) [I48.0] Weakness [R53.1] Elevated troponin I level [R74.8] Other chronic back pain [M54.9, G89.29]  DISCHARGE DIAGNOSIS:  Paroxysmal atrial fibrillation  HOSPITAL COURSE:   1.  Atrial fibrillation with rapid ventricular response. Patient converted over to normal sinus rhythm. I will classify this as paroxysmal atrial fibrillation. Metoprolol increased 100 mg twice a day. Patient already on Eliquis for anticoagulation. Must weigh benefits and risks of Eliquis on a patient with dementia and fall risk. 2. Dehydration and acute kidney injury. Patient given IV fluid hydration overnight and creatinine had improved from 1.53 down to 1.04. 3. Hypomagnesemia this has improved with IV fluid hydration 4. Mild rhabdomyolysis with a CPK of 854. This has improved with IV fluid hydration down to 544. Discontinue Crestor. 5. Hypothyroidism unspecified with TSH in the hyperthyroid range. Decrease Synthroid down to 50 g daily 6. Weakness. Physical therapy recommended home with home health 7. Dementia with behavioral disturbance. Trial of low-dose Seroquel at night 8. Type 2 diabetes mellitus on Glucophage 9. Essential hypertension continue metoprolol increased dose 10. Elevated troponin secondary to demand ischemia from rapid heart rate 11. Thrombocytopenia. follow-up as outpatient.  DISCHARGE CONDITIONS:   Fair  CONSULTS OBTAINED:  Treatment Team:  Teodoro Spray, MD  DRUG ALLERGIES:   Allergies  Allergen Reactions  . Contrast Media [Iodinated Diagnostic Agents] Other (See Comments)    Dizziness.  . Diphenhydramine     dizzy  .  Fenofibrate Other (See Comments)    Anorexia, Fatigue.  Marland Kitchen Lisinopril     cough    DISCHARGE MEDICATIONS:   Discharge Medication List as of 04/29/2017 12:27 PM    START taking these medications   Details  magnesium oxide (MAG-OX) 400 (241.3 Mg) MG tablet Take 1 tablet (400 mg total) by mouth daily., Starting Sat 04/30/2017, Print    QUEtiapine (SEROQUEL) 25 MG tablet Take 0.5 tablets (12.5 mg total) by mouth at bedtime., Starting Fri 04/29/2017, Print      CONTINUE these medications which have CHANGED   Details  levothyroxine (SYNTHROID, LEVOTHROID) 50 MCG tablet Take 1 tablet (50 mcg total) by mouth daily before breakfast., Starting Sat 04/30/2017, Print    metoprolol tartrate (LOPRESSOR) 100 MG tablet Take 1 tablet (100 mg total) by mouth 2 (two) times daily., Starting Fri 04/29/2017, Print      CONTINUE these medications which have NOT CHANGED   Details  acetaminophen (TYLENOL) 500 MG tablet Take 1,000 mg by mouth daily as needed., Historical Med    apixaban (ELIQUIS) 5 MG TABS tablet Take 5 mg by mouth 2 (two) times daily., Historical Med    busPIRone (BUSPAR) 5 MG tablet Take 5 mg by mouth every evening., Historical Med    Cholecalciferol (CVS D3) 2000 units CAPS Take 2,000 Units by mouth daily., Historical Med    finasteride (PROSCAR) 5 MG tablet Take 5 mg by mouth daily., Historical Med    ipratropium (ATROVENT) 0.03 % nasal spray Place 2 sprays into both nostrils 3 (three) times daily as needed for rhinitis., Historical Med    ketoconazole (NIZORAL) 2 % cream Apply 1 application topically daily as needed for irritation (Rash on leg).,  Historical Med    MELATONIN PO Take 1 tablet by mouth at bedtime as needed (SLEEP)., Historical Med    metFORMIN (GLUCOPHAGE) 500 MG tablet Take 250 mg by mouth 2 (two) times daily with a meal., Historical Med    Multiple Vitamin (MULTIVITAMIN) tablet Take 1 tablet by mouth daily., Historical Med    pantoprazole (PROTONIX) 40 MG tablet  Take 40 mg by mouth every other day., Historical Med    Rivastigmine 13.3 MG/24HR PT24 Place 13.3 mg onto the skin daily., Historical Med    tamsulosin (FLOMAX) 0.4 MG CAPS capsule Take 0.4 mg by mouth daily., Historical Med    testosterone cypionate (DEPOTESTOSTERONE CYPIONATE) 200 MG/ML injection Inject 200 mg into the muscle every 28 (twenty-eight) days., Historical Med      STOP taking these medications     amLODipine (NORVASC) 5 MG tablet      predniSONE (DELTASONE) 5 MG tablet      rosuvastatin (CRESTOR) 10 MG tablet          DISCHARGE INSTRUCTIONS:   Follow-up PMD one week  If you experience worsening of your admission symptoms, develop shortness of breath, life threatening emergency, suicidal or homicidal thoughts you must seek medical attention immediately by calling 911 or calling your MD immediately  if symptoms less severe.  You Must read complete instructions/literature along with all the possible adverse reactions/side effects for all the Medicines you take and that have been prescribed to you. Take any new Medicines after you have completely understood and accept all the possible adverse reactions/side effects.   Please note  You were cared for by a hospitalist during your hospital stay. If you have any questions about your discharge medications or the care you received while you were in the hospital after you are discharged, you can call the unit and asked to speak with the hospitalist on call if the hospitalist that took care of you is not available. Once you are discharged, your primary care physician will handle any further medical issues. Please note that NO REFILLS for any discharge medications will be authorized once you are discharged, as it is imperative that you return to your primary care physician (or establish a relationship with a primary care physician if you do not have one) for your aftercare needs so that they can reassess your need for medications and  monitor your lab values.    Today   CHIEF COMPLAINT:   Chief Complaint  Patient presents with  . Back Pain    HISTORY OF PRESENT ILLNESS:  Zachary Serrano  is a 83 y.o. male with a known history of Presented with atrial fibrillation rapid ventricular response and converted over to normal sinus rhythm   VITAL SIGNS:  Blood pressure 138/61, pulse (!) 103, temperature 98.2 F (36.8 C), temperature source Oral, resp. rate 20, height 5\' 11"  (1.803 m), weight 78 kg (172 lb), SpO2 94 %. Prior heart rates have been in the 70s  PHYSICAL EXAMINATION:  GENERAL:  83 y.o.-year-old patient lying in the bed with no acute distress.  EYES: Pupils equal, round, reactive to light and accommodation. No scleral icterus. Extraocular muscles intact.  HEENT: Head atraumatic, normocephalic. Oropharynx and nasopharynx clear.  NECK:  Supple, no jugular venous distention. No thyroid enlargement, no tenderness.  LUNGS: Normal breath sounds bilaterally, no wheezing, rales,rhonchi or crepitation. No use of accessory muscles of respiration.  CARDIOVASCULAR: S1, S2 normal. No murmurs, rubs, or gallops.  ABDOMEN: Soft, non-tender, non-distended. Bowel sounds present. No organomegaly  or mass.  EXTREMITIES: No pedal edema, cyanosis, or clubbing.  NEUROLOGIC: Cranial nerves II through XII are intact. Muscle strength 5/5 in all extremities. Sensation intact. Gait not checked.  PSYCHIATRIC: The patient is alert and answers yes or no questions appropriately.  SKIN: No obvious rash, lesion, or ulcer.   DATA REVIEW:   CBC  Recent Labs Lab 04/29/17 0537  WBC 6.8  HGB 13.0  HCT 37.7*  PLT 134*    Chemistries   Recent Labs Lab 04/28/17 0831 04/29/17 0537  NA 135 138  K 3.7 3.7  CL 101 106  CO2 26 27  GLUCOSE 143* 136*  BUN 27* 20  CREATININE 1.53* 1.04  CALCIUM 9.0 8.4*  MG 1.6*  --   AST 46*  --   ALT 24  --   ALKPHOS 76  --   BILITOT 1.4*  --     Cardiac Enzymes  Recent Labs Lab  04/28/17 2117  TROPONINI 0.10*    Microbiology Results  Results for orders placed or performed during the hospital encounter of 04/28/17  Urine culture     Status: None   Collection Time: 04/28/17  9:32 AM  Result Value Ref Range Status   Specimen Description URINE, CLEAN CATCH  Final   Special Requests Normal  Final   Culture   Final    NO GROWTH Performed at [facility], [place] [address] [place],  [address]    Report Status 04/29/2017 FINAL  Final    RADIOLOGY:  Dg Lumbar Spine 2-3 Views  Result Date: 04/28/2017 CLINICAL DATA:  83 year old male with chronic lumbar back pain, prior surgery. Continued pain. EXAM: LUMBAR SPINE - 2-3 VIEW COMPARISON:  None. FINDINGS: Sequelae of lower lumbar and sacral fusion. Lumbar segmentation appears normal, with previous posterior and interbody fusion at L4-L5 and L5-S1. Transpedicular hardware remains in place. Severe disc space loss with vacuum disc and mild retrolisthesis of L3 on L4. Moderate facet hypertrophy at that level. A degree of underlying osteopenia. No acute osseous abnormality identified. Extensive Calcified aortic atherosclerosis. IMPRESSION: 1. Prior L4-L5 and L5-S1 fusion with solid appearing arthrodesis. 2. 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 3. Extensive Calcified aortic atherosclerosis. Electronically Signed   By: Genevie Ann M.D.   On: 04/28/2017 08:34      Management plans discussed with the patient, family and they are in agreement.  CODE STATUS:     Code Status Orders        Start     Ordered   04/28/17 1103  Do not attempt resuscitation (DNR)  Continuous    Question Answer Comment  In the event of cardiac or respiratory ARREST Do not call a "code blue"   In the event of cardiac or respiratory ARREST Do not perform Intubation, CPR, defibrillation or ACLS   In the event of cardiac or respiratory ARREST Use medication by any route, position, wound care, and  other measures to relive pain and suffering. May use oxygen, suction and manual treatment of airway obstruction as needed for comfort.      04/28/17 1102    Code Status History    Date Active Date Inactive Code Status Order ID Comments User Context   04/28/2017 10:30 AM 04/28/2017 11:02 AM Full Code [id number]  Hillary Bow, MD ED    Advance Directive Documentation     Most Recent Value  Type of Advance Directive  Healthcare Power of Attorney  Pre-existing out of facility  DNR order (yellow form or pink MOST form)  -  "MOST" Form in Place?  -      TOTAL TIME TAKING CARE OF THIS PATIENT: 35 minutes.    [name] M.D on 04/29/2017 at 4:21 PM  Between 7am to 6pm - Pager - [phone]  After 6pm go to [email or web address] - password [email or web address]  [facility] Office  [phone]  CC: Primary care physician; Kirk Ruths, MD

## 2017-04-29 NOTE — Progress Notes (Signed)
Medical record request form faxed per family's request.

## 2017-04-29 NOTE — Progress Notes (Signed)
Physical Therapy Treatment Patient Details Name: Zachary Serrano MRN: [id number] DOB: 1928-09-18 Today's Date: 04/29/2017    History of Present Illness 83 y.o. male with a known history of Hypothyroidism, atrial fibrillation, hypertension, Alzheimer's presents to the emergency room brought in by his wife and son from independent living facility due to weakness and unable to walk.     PT Comments    Pt did well with ambulation again today and was able to maintain consistent speed and cadence though he did need consistent cuing secondary to confusion.  He did not have any overt LOBs but needed a lot of cuing to insure safety/awareness.  Overall pt is a little weaker than baseline and does need supervision but should be able to return to independent living facility with assist from family and HHPT.    Follow Up Recommendations  Home health PT     Equipment Recommendations       Recommendations for Other Services       Precautions / Restrictions Precautions Precautions: Fall Restrictions Weight Bearing Restrictions: No    Mobility  Bed Mobility Overal bed mobility: Modified Independent             General bed mobility comments: Pt again needed some extra time and effort to get to sitting at EOB, confused with scooting in bed but phyiscally able to do so.  Transfers Overall transfer level: Modified independent Equipment used: Rolling walker (2 wheeled)             General transfer comment: VCs for hand placement, sequencing but able to rise to standing from low height w/o phyiscal assist  Ambulation/Gait Ambulation/Gait assistance: Supervision Ambulation Distance (Feet): 200 Feet Assistive device: Rolling walker (2 wheeled)       General Gait Details: Pt again able to circumambulate the nurses' station w/o assist safety concerns though he did need consistent cuing for directions and to stay on task.  PT with some minimal fatigue during the effort but O2 stayed in  the 90s on room air and ultimately he was not too far from his baseline   Stairs            Wheelchair Mobility    Modified Rankin (Stroke Patients Only)       Balance Overall balance assessment: Modified Independent (pt's balance issues stem from confusion/mental status)                                          Cognition Arousal/Alertness: Awake/alert Behavior During Therapy: Impulsive;WFL for tasks assessed/performed Overall Cognitive Status: History of cognitive impairments - at baseline                                        Exercises      General Comments        Pertinent Vitals/Pain Pain Assessment: No/denies pain    Home Living                      Prior Function            PT Goals (current goals can now be found in the care plan section) Progress towards PT goals: Progressing toward goals    Frequency    Min 2X/week      PT Plan Current plan remains  appropriate    Co-evaluation              AM-PAC PT "6 Clicks" Daily Activity  Outcome Measure  Difficulty turning over in bed (including adjusting bedclothes, sheets and blankets)?: A Little Difficulty moving from lying on back to sitting on the side of the bed? : A Little Difficulty sitting down on and standing up from a chair with arms (e.g., wheelchair, bedside commode, etc,.)?: A Little Help needed moving to and from a bed to chair (including a wheelchair)?: None Help needed walking in hospital room?: None Help needed climbing 3-5 steps with a railing? : A Little 6 Click Score: 20    End of Session Equipment Utilized During Treatment: Gait belt Activity Tolerance: Patient tolerated treatment well Patient left: with chair alarm set;with call bell/phone within reach   PT Visit Diagnosis: Difficulty in walking, not elsewhere classified (R26.2);Muscle weakness (generalized) (M62.81)     Time: 6237-6283 PT Time Calculation (min) (ACUTE  ONLY): 17 min  Charges:  $Gait Training: 8-22 mins                    G Codes:       Kreg Shropshire, DPT 04/29/2017, 11:57 AM

## 2018-04-15 ENCOUNTER — Other Ambulatory Visit: Payer: Self-pay

## 2018-04-15 ENCOUNTER — Encounter: Payer: Self-pay | Admitting: Emergency Medicine

## 2018-04-15 ENCOUNTER — Observation Stay
Admission: EM | Admit: 2018-04-15 | Discharge: 2018-04-19 | Disposition: A | Discharge status: Home Health | Payer: Medicare Other | Attending: Specialist | Admitting: Specialist

## 2018-04-15 ENCOUNTER — Emergency Department: Payer: Medicare Other

## 2018-04-15 DIAGNOSIS — Z7984 Long term (current) use of oral hypoglycemic drugs: Secondary | ICD-10-CM | POA: Insufficient documentation

## 2018-04-15 DIAGNOSIS — Z888 Allergy status to other drugs, medicaments and biological substances status: Secondary | ICD-10-CM | POA: Diagnosis not present

## 2018-04-15 DIAGNOSIS — I10 Essential (primary) hypertension: Secondary | ICD-10-CM | POA: Diagnosis not present

## 2018-04-15 DIAGNOSIS — Z91041 Radiographic dye allergy status: Secondary | ICD-10-CM | POA: Insufficient documentation

## 2018-04-15 DIAGNOSIS — E119 Type 2 diabetes mellitus without complications: Secondary | ICD-10-CM | POA: Diagnosis not present

## 2018-04-15 DIAGNOSIS — Z7901 Long term (current) use of anticoagulants: Secondary | ICD-10-CM | POA: Diagnosis not present

## 2018-04-15 DIAGNOSIS — F0280 Dementia in other diseases classified elsewhere without behavioral disturbance: Secondary | ICD-10-CM | POA: Insufficient documentation

## 2018-04-15 DIAGNOSIS — E079 Disorder of thyroid, unspecified: Secondary | ICD-10-CM | POA: Insufficient documentation

## 2018-04-15 DIAGNOSIS — J189 Pneumonia, unspecified organism: Secondary | ICD-10-CM | POA: Insufficient documentation

## 2018-04-15 DIAGNOSIS — A419 Sepsis, unspecified organism: Principal | ICD-10-CM | POA: Insufficient documentation

## 2018-04-15 DIAGNOSIS — E860 Dehydration: Secondary | ICD-10-CM | POA: Insufficient documentation

## 2018-04-15 DIAGNOSIS — L8990 Pressure ulcer of unspecified site, unspecified stage: Secondary | ICD-10-CM | POA: Diagnosis not present

## 2018-04-15 DIAGNOSIS — R509 Fever, unspecified: Secondary | ICD-10-CM

## 2018-04-15 DIAGNOSIS — Z79899 Other long term (current) drug therapy: Secondary | ICD-10-CM | POA: Insufficient documentation

## 2018-04-15 DIAGNOSIS — R262 Difficulty in walking, not elsewhere classified: Secondary | ICD-10-CM | POA: Diagnosis not present

## 2018-04-15 DIAGNOSIS — Z79890 Hormone replacement therapy (postmenopausal): Secondary | ICD-10-CM | POA: Insufficient documentation

## 2018-04-15 DIAGNOSIS — G309 Alzheimer's disease, unspecified: Secondary | ICD-10-CM | POA: Diagnosis not present

## 2018-04-15 DIAGNOSIS — I482 Chronic atrial fibrillation: Secondary | ICD-10-CM | POA: Diagnosis not present

## 2018-04-15 DIAGNOSIS — R531 Weakness: Secondary | ICD-10-CM | POA: Diagnosis present

## 2018-04-15 DIAGNOSIS — N400 Enlarged prostate without lower urinary tract symptoms: Secondary | ICD-10-CM | POA: Diagnosis not present

## 2018-04-15 DIAGNOSIS — Z66 Do not resuscitate: Secondary | ICD-10-CM | POA: Insufficient documentation

## 2018-04-15 LAB — BASIC METABOLIC PANEL
ANION GAP: 10 (ref 5–15)
BUN: 14 mg/dL (ref 6–20)
CHLORIDE: 102 mmol/L (ref 101–111)
CO2: 23 mmol/L (ref 22–32)
Calcium: 8.4 mg/dL — ABNORMAL LOW (ref 8.9–10.3)
Creatinine, Ser: 1.15 mg/dL (ref 0.61–1.24)
GFR calc non Af Amer: 55 mL/min — ABNORMAL LOW (ref >60)
Glucose, Bld: 199 mg/dL — ABNORMAL HIGH (ref 65–99)
Potassium: 3.2 mmol/L — ABNORMAL LOW (ref 3.5–5.1)
Sodium: 135 mmol/L (ref 135–145)

## 2018-04-15 LAB — CBC
HCT: 37.9 % — ABNORMAL LOW (ref 40.0–52.0)
HEMOGLOBIN: 13.3 g/dL (ref 13.0–18.0)
MCH: 31.9 pg (ref 26.0–34.0)
MCHC: 34.9 g/dL (ref 32.0–36.0)
MCV: 91.3 fL (ref 80.0–100.0)
Platelets: 208 10*3/uL (ref 150–440)
RBC: 4.15 MIL/uL — AB (ref 4.40–5.90)
RDW: 14 % (ref 11.5–14.5)
WBC: 7.9 10*3/uL (ref 3.8–10.6)

## 2018-04-15 LAB — URINALYSIS, COMPLETE (UACMP) WITH MICROSCOPIC
Bacteria, UA: NONE SEEN
Bilirubin Urine: NEGATIVE
Glucose, UA: 150 mg/dL — AB
HGB URINE DIPSTICK: NEGATIVE
KETONES UR: NEGATIVE mg/dL
LEUKOCYTES UA: NEGATIVE
Nitrite: NEGATIVE
PROTEIN: NEGATIVE mg/dL
SQUAMOUS EPITHELIAL / LPF: NONE SEEN (ref 0–5)
Specific Gravity, Urine: 1.015 (ref 1.005–1.030)
WBC UA: NONE SEEN WBC/hpf (ref 0–5)
pH: 5 (ref 5.0–8.0)

## 2018-04-15 LAB — LACTIC ACID, PLASMA
Lactic Acid, Venous: 2.2 mmol/L (ref 0.5–1.9)
Lactic Acid, Venous: 2.2 mmol/L (ref 0.5–1.9)
Lactic Acid, Venous: 2.9 mmol/L (ref 0.5–1.9)

## 2018-04-15 LAB — GLUCOSE, CAPILLARY
GLUCOSE-CAPILLARY: 236 mg/dL — AB (ref 65–99)
Glucose-Capillary: 172 mg/dL — ABNORMAL HIGH (ref 65–99)

## 2018-04-15 LAB — TROPONIN I: TROPONIN I: 0.03 ng/mL — AB (ref <0.03)

## 2018-04-15 MED ORDER — FINASTERIDE 5 MG PO TABS
5.0000 mg | ORAL_TABLET | Freq: Every day | ORAL | Status: DC
  Administered 2018-04-15 – 2018-04-19 (×5): 5 mg via ORAL
  Filled 2018-04-15 (×5): qty 1

## 2018-04-15 MED ORDER — APIXABAN 5 MG PO TABS
5.0000 mg | ORAL_TABLET | Freq: Two times a day (BID) | ORAL | Status: DC
  Administered 2018-04-15 – 2018-04-19 (×8): 5 mg via ORAL
  Filled 2018-04-15 (×8): qty 1

## 2018-04-15 MED ORDER — LEVOTHYROXINE SODIUM 50 MCG PO TABS
75.0000 ug | ORAL_TABLET | Freq: Every day | ORAL | Status: DC
  Administered 2018-04-16 – 2018-04-19 (×4): 75 ug via ORAL
  Filled 2018-04-15 (×4): qty 1

## 2018-04-15 MED ORDER — SODIUM CHLORIDE 0.9% FLUSH: 3.0000 mL | INTRAVENOUS | Status: DC | PRN

## 2018-04-15 MED ORDER — METFORMIN HCL 500 MG PO TABS
250.0000 mg | ORAL_TABLET | Freq: Two times a day (BID) | ORAL | Status: DC
  Administered 2018-04-15: 250 mg via ORAL
  Filled 2018-04-15 (×3): qty 1

## 2018-04-15 MED ORDER — POTASSIUM CHLORIDE CRYS ER 20 MEQ PO TBCR
40.0000 meq | EXTENDED_RELEASE_TABLET | Freq: Once | ORAL | Status: AC
  Administered 2018-04-15: 40 meq via ORAL
  Filled 2018-04-15: qty 2

## 2018-04-15 MED ORDER — INSULIN ASPART 100 UNIT/ML ~~LOC~~ SOLN
0.0000 [IU] | Freq: Three times a day (TID) | SUBCUTANEOUS | Status: DC
  Administered 2018-04-15 – 2018-04-16 (×4): 3 [IU] via SUBCUTANEOUS
  Administered 2018-04-17 – 2018-04-19 (×4): 2 [IU] via SUBCUTANEOUS
  Filled 2018-04-15 (×8): qty 1

## 2018-04-15 MED ORDER — QUETIAPINE FUMARATE 25 MG PO TABS
12.5000 mg | ORAL_TABLET | Freq: Every day | ORAL | Status: DC
  Administered 2018-04-15 – 2018-04-18 (×4): 12.5 mg via ORAL
  Filled 2018-04-15 (×4): qty 1

## 2018-04-15 MED ORDER — MELATONIN 5 MG PO TABS
ORAL_TABLET | Freq: Every evening | ORAL | Status: DC | PRN
  Filled 2018-04-15 (×2): qty 1

## 2018-04-15 MED ORDER — KETOCONAZOLE 2 % EX CREA
1.0000 "application " | TOPICAL_CREAM | Freq: Every day | CUTANEOUS | Status: DC | PRN
  Filled 2018-04-15: qty 15

## 2018-04-15 MED ORDER — SODIUM CHLORIDE 0.9 % IV SOLN: 250.0000 mL | INTRAVENOUS | Status: DC | PRN

## 2018-04-15 MED ORDER — PANTOPRAZOLE SODIUM 40 MG PO TBEC
40.0000 mg | DELAYED_RELEASE_TABLET | ORAL | Status: DC
  Administered 2018-04-16 – 2018-04-19 (×3): 40 mg via ORAL
  Filled 2018-04-15 (×3): qty 1

## 2018-04-15 MED ORDER — ONDANSETRON HCL 4 MG PO TABS: 4.0000 mg | ORAL_TABLET | Freq: Four times a day (QID) | ORAL | Status: DC | PRN

## 2018-04-15 MED ORDER — ACETAMINOPHEN 325 MG PO TABS
650.0000 mg | ORAL_TABLET | Freq: Four times a day (QID) | ORAL | Status: DC | PRN
Start: 2018-04-15 — End: 2018-04-19
  Administered 2018-04-16 – 2018-04-19 (×2): 650 mg via ORAL
  Filled 2018-04-15 (×2): qty 2

## 2018-04-15 MED ORDER — SODIUM CHLORIDE 0.9 % IV BOLUS
500.0000 mL | Freq: Once | INTRAVENOUS | Status: AC
  Administered 2018-04-15: 500 mL via INTRAVENOUS

## 2018-04-15 MED ORDER — SENNOSIDES-DOCUSATE SODIUM 8.6-50 MG PO TABS
1.0000 | ORAL_TABLET | Freq: Every evening | ORAL | Status: DC | PRN
  Administered 2018-04-16 – 2018-04-18 (×2): 1 via ORAL
  Filled 2018-04-15 (×2): qty 1

## 2018-04-15 MED ORDER — SODIUM CHLORIDE 0.9 % IV SOLN
500.0000 mg | INTRAVENOUS | Status: DC
  Administered 2018-04-15 – 2018-04-17 (×3): 500 mg via INTRAVENOUS
  Filled 2018-04-15 (×4): qty 500

## 2018-04-15 MED ORDER — TAMSULOSIN HCL 0.4 MG PO CAPS
0.4000 mg | ORAL_CAPSULE | Freq: Every day | ORAL | Status: DC
  Administered 2018-04-15 – 2018-04-19 (×5): 0.4 mg via ORAL
  Filled 2018-04-15 (×5): qty 1

## 2018-04-15 MED ORDER — SODIUM CHLORIDE 0.9 % IV SOLN
INTRAVENOUS | Status: DC
  Administered 2018-04-15 – 2018-04-17 (×4): via INTRAVENOUS

## 2018-04-15 MED ORDER — AZITHROMYCIN 500 MG IV SOLR: 500.0000 mg | INTRAVENOUS | Status: DC

## 2018-04-15 MED ORDER — SODIUM CHLORIDE 0.9 % IV SOLN: 1.0000 g | INTRAVENOUS | Status: DC

## 2018-04-15 MED ORDER — INSULIN ASPART 100 UNIT/ML ~~LOC~~ SOLN: 0.0000 [IU] | Freq: Every day | SUBCUTANEOUS | Status: DC

## 2018-04-15 MED ORDER — SODIUM CHLORIDE 0.9% FLUSH
3.0000 mL | Freq: Two times a day (BID) | INTRAVENOUS | Status: DC
  Administered 2018-04-17 – 2018-04-18 (×3): 3 mL via INTRAVENOUS

## 2018-04-15 MED ORDER — METOPROLOL TARTRATE 50 MG PO TABS
100.0000 mg | ORAL_TABLET | Freq: Two times a day (BID) | ORAL | Status: DC
  Administered 2018-04-15 – 2018-04-19 (×8): 100 mg via ORAL
  Filled 2018-04-15 (×8): qty 2

## 2018-04-15 MED ORDER — HALOPERIDOL LACTATE 5 MG/ML IJ SOLN
2.0000 mg | Freq: Three times a day (TID) | INTRAMUSCULAR | Status: DC | PRN
  Administered 2018-04-15 – 2018-04-18 (×3): 2 mg via INTRAVENOUS
  Filled 2018-04-15 (×3): qty 1

## 2018-04-15 MED ORDER — SODIUM CHLORIDE 0.9 % IV SOLN
1.0000 g | INTRAVENOUS | Status: DC
  Administered 2018-04-15 – 2018-04-17 (×3): 1 g via INTRAVENOUS
  Filled 2018-04-15: qty 1
  Filled 2018-04-15 (×3): qty 10
  Filled 2018-04-15: qty 1

## 2018-04-15 MED ORDER — ONDANSETRON HCL 4 MG/2ML IJ SOLN: 4.0000 mg | Freq: Four times a day (QID) | INTRAMUSCULAR | Status: DC | PRN

## 2018-04-15 MED ORDER — ACETAMINOPHEN 650 MG RE SUPP: 650.0000 mg | Freq: Four times a day (QID) | RECTAL | Status: DC | PRN

## 2018-04-15 NOTE — Progress Notes (Signed)
Patient cnfused and trying to get up without assistance. MD paged to obtain a sitter-telesitter order.

## 2018-04-15 NOTE — ED Notes (Signed)
X-ray at bedside

## 2018-04-15 NOTE — Progress Notes (Signed)
Low bed ordered. Confirmation [id number]

## 2018-04-15 NOTE — Progress Notes (Signed)
Patient and family oriented to room. IV fluids infusing and family updated on plan of care.

## 2018-04-15 NOTE — ED Provider Notes (Signed)
[name] Note ____________________________________________   First MD Initiated Contact with Patient 04/15/18 [phone]     (approximate)  I have reviewed the triage vital signs and the nursing notes.   HISTORY  Chief Complaint Weakness  Level 5 caveat: Unable to obtain complete HPI due to dementia  HPI Zachary Serrano is a 84 y.o. male with PMH as noted below who presents with generalized weakness over the last several days, acutely worse today when the wife states that the patient fell to the ground and could not get up.  This also happened 2 days ago although that time EMS came and the patient was helped into bed.  They also report that the patient has had a cough and some urinary incontinence.  The patient denies pain or other acute symptoms at this time.  Past Medical History:  Diagnosis Date  . A-fib ([place])   . Alzheimer disease   . Arthritis   . Cancer ([place])    skin  . Diabetes mellitus without complication ([place])   . Hypertension   . 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 Thyroid disease     Patient Active Problem List   Diagnosis Date Noted  . A-fib ([place]) 04/28/2017    Past Surgical History:  Procedure Laterality Date  . BRAIN SURGERY    . HERNIA REPAIR    . JOINT REPLACEMENT      Prior to Admission medications   Medication Sig Start Date End Date Taking? Authorizing Provider  acetaminophen (TYLENOL) 500 MG tablet Take 1,000 mg by mouth daily as needed for mild pain.    Yes [provider]  apixaban (ELIQUIS) 5 MG TABS tablet Take 5 mg by mouth 2 (two) times daily.   Yes [provider]  finasteride (PROSCAR) 5 MG tablet Take 5 mg by mouth daily.   Yes [provider]  ketoconazole (NIZORAL) 2 % cream Apply 1 application topically daily as needed for irritation (Rash on leg).   Yes [provider]  levothyroxine (SYNTHROID, LEVOTHROID) 75  MCG tablet Take 75 mcg by mouth daily. 04/10/18  Yes [provider]  MELATONIN PO Take 1 tablet by mouth at bedtime as needed (SLEEP).   Yes [provider]  metFORMIN (GLUCOPHAGE) 500 MG tablet Take 250 mg by mouth 2 (two) times daily with a meal.   Yes [provider]  metoprolol tartrate (LOPRESSOR) 100 MG tablet Take 1 tablet (100 mg total) by mouth 2 (two) times daily. 04/29/17  Yes Wieting, Richard, MD  pantoprazole (PROTONIX) 40 MG tablet Take 40 mg by mouth every other day.   Yes [provider]  QUEtiapine (SEROQUEL) 25 MG tablet Take 0.5 tablets (12.5 mg total) by mouth at bedtime. 04/29/17  Yes Wieting, Richard, MD  tamsulosin (FLOMAX) 0.4 MG CAPS capsule Take 0.4 mg by mouth daily.   Yes [provider]  levothyroxine (SYNTHROID, LEVOTHROID) 50 MCG tablet Take 1 tablet (50 mcg total) by mouth daily before breakfast. Patient not taking: Reported on 04/15/2018 04/30/17   [name], MD  magnesium oxide (MAG-OX) 400 (241.3 Mg) MG tablet Take 1 tablet (400 mg total) by mouth daily. Patient not taking: Reported on 04/15/2018 04/30/17   [name], MD    Allergies Contrast media [iodinated diagnostic agents]; Diphenhydramine; Fenofibrate; and Lisinopril  Family History  Problem Relation Age of Onset  . Colon cancer Neg Hx     Social History Social History  Tobacco Use  . Smoking status: Never Smoker  . Smokeless tobacco: Never Used  Substance Use Topics  . Alcohol use: No  . Drug use: No    Review of Systems Level 5 caveat: Unable to obtain complete review of systems due to dementia Constitutional: Positive for fever. Cardiovascular: Denies chest pain. Respiratory: Denies shortness of breath. Gastrointestinal: No vomiting. Genitourinary: Negative for dysuria.  Musculoskeletal: Negative for back pain. Skin: Negative for rash. Neurological: Negative for  headache.  ____________________________________________   PHYSICAL EXAM:  VITAL SIGNS: ED Triage Vitals  Enc Vitals Group     BP 04/15/18 0932 (!) 152/70     Pulse Rate 04/15/18 0932 (!) 107     Resp 04/15/18 0932 20     Temp 04/15/18 0932 98.9 F (37.2 C)     Temp Source 04/15/18 0932 Oral     SpO2 04/15/18 0932 94 %     Weight 04/15/18 0928 180 lb (81.6 kg)     Height 04/15/18 0928 5\' 9"  (1.753 m)     Head Circumference --      Peak Flow --      Pain Score 04/15/18 0928 5     Pain Loc --      Pain Edu? --      Excl. in [place]? --     Constitutional: Alert.  Relatively comfortable appearing and in no acute distress. Eyes: Conjunctivae are normal.  EOMI.  PERRLA. Head: Atraumatic. Nose: No congestion/rhinnorhea. Mouth/Throat: Mucous membranes are dry.   Neck: Normal range of motion.  Cardiovascular: Normal rate, regular rhythm. Grossly normal heart sounds.  Good peripheral circulation. Respiratory: Normal respiratory effort.  No retractions. Lungs CTAB. Gastrointestinal: Soft and nontender. No distention.  Genitourinary: No flank tenderness. Musculoskeletal: No lower extremity edema.  Extremities warm and well perfused.  Neurologic:  Normal speech and language. No gross focal neurologic deficits are appreciated.  Skin:  Skin is warm and dry. No rash noted. Psychiatric: Mood and affect are normal. Speech and behavior are normal.  ____________________________________________   LABS (all labs ordered are listed, but only abnormal results are displayed)  Labs Reviewed  BASIC METABOLIC PANEL - Abnormal; Notable for the following components:      Result Value   Potassium 3.2 (*)    Glucose, Bld 199 (*)    Calcium 8.4 (*)    GFR calc non Af Amer 55 (*)    All other components within normal limits  CBC - Abnormal; Notable for the following components:   RBC 4.15 (*)    HCT 37.9 (*)    All other components within normal limits  URINALYSIS, COMPLETE (UACMP) WITH  MICROSCOPIC - Abnormal; Notable for the following components:   Color, Urine YELLOW (*)    APPearance CLEAR (*)    Glucose, UA 150 (*)    All other components within normal limits  TROPONIN I - Abnormal; Notable for the following components:   Troponin I 0.03 (*)    All other components within normal limits  LACTIC ACID, PLASMA - Abnormal; Notable for the following components:   Lactic Acid, Venous 2.2 (*)    All other components within normal limits  CULTURE, BLOOD (ROUTINE X 2)  CULTURE, BLOOD (ROUTINE X 2)  LACTIC ACID, PLASMA  CBG MONITORING, ED   ____________________________________________  EKG  ED ECG REPORT I, Arta Silence, the attending physician, personally viewed and interpreted this ECG.  Date: 04/15/2018 EKG Time: 932 Rate: 106 Rhythm: Sinus tachycardia QRS Axis: normal Intervals: normal  ST/T Wave abnormalities: Nonspecific chronic abnormalities Narrative Interpretation: no evidence of acute ischemia  ____________________________________________  RADIOLOGY  CXR: Right lung opacity  ____________________________________________   PROCEDURES  Procedure(s) performed: No  Procedures  Critical Care performed: No ____________________________________________   INITIAL IMPRESSION / ASSESSMENT AND PLAN / ED COURSE  Pertinent labs & imaging results that were available during my care of the patient were reviewed by me and considered in my medical decision making (see chart for details).  84 year old male with PMH as noted above presents with generalized weakness over the last several days associated with low-grade fever today, some cough, and urinary incontinence.  I reviewed the past medical records in epic; the patient was last admitted about 1 year ago for atrial fibrillation.  On exam, he is relatively well-appearing for age, slightly tachycardic, has dry mucous membranes, and the remainder the exam is as described above.  Differential includes  infection, especially UTI or pneumonia, dehydration or other metabolic etiology, or less likely cardiac cause.  Plan: Labs, infection work-up, IV fluids, and reassess.  ----------------------------------------- 12:17 PM on 04/15/2018 -----------------------------------------  Chest x-ray shows findings consistent with pneumonia, which corresponds with patient's cough and fever.  His lactate is slightly elevated, and his troponin is minimally elevated.  Other lab work-up is unremarkable.  Given the pneumonia with generalized weakness and likely sepsis, the patient will require admission.  I started antibiotics for community acquired pneumonia and additional fluids.  I signed the patient out to the hospitalist Dr. Estanislado Pandy.  The patient follows at the [place], however he wishes to wave transfer to the [place] and would prefer to be admitted here due to distance.  He states that he has other insurance that would cover him here. ____________________________________________   FINAL CLINICAL IMPRESSION(S) / ED DIAGNOSES  Final diagnoses:  Community acquired pneumonia, unspecified laterality  Sepsis, due to unspecified organism ([place])  Dehydration      NEW MEDICATIONS STARTED DURING THIS VISIT:  New Prescriptions   No medications on file     Note:  This document was prepared using Dragon voice recognition software and may include unintentional dictation errors.    Arta Silence, MD 04/15/18 1218

## 2018-04-15 NOTE — H&P (Signed)
[place] [facility] [place] NAME: Zachary Serrano    MR#:  [id number]  DATE OF BIRTH:  May 22, 1928  DATE OF ADMISSION:  04/15/2018  PRIMARY CARE PHYSICIAN: Kirk Ruths, MD   REQUESTING/REFERRING PHYSICIAN:   CHIEF COMPLAINT:   Chief Complaint  Patient presents with  . Weakness    HISTORY OF PRESENT ILLNESS: Zachary Serrano  is a 84 y.o. male with a known history of Alzheimer's dementia, chronic atrial fibrillation, skin cancer, diabetes mellitus type 2, thyroid disease presented to the emergency room with generalized weakness, cough.  Patient has this cough going on for couple of days and also had some episodic shortness of breath.  Patient lives at [facility] independent living facility.  Cough is productive of yellowish phlegm.  Patient was evaluated in the emergency room was found to have pneumonia on chest x-rays and started on IV antibiotics.  Has generalized weakness and lactic acid level was also elevated.  Hospitalist service was consulted for further care.  No complaints of any chest pain.  PAST MEDICAL HISTORY:   Past Medical History:  Diagnosis Date  . A-fib ([place])   . Alzheimer disease   . Arthritis   . Cancer ([place])    skin  . Diabetes mellitus without complication ([place])   . Hypertension   . 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 Thyroid disease     PAST SURGICAL HISTORY:  Past Surgical History:  Procedure Laterality Date  . BRAIN SURGERY    . HERNIA REPAIR    . JOINT REPLACEMENT      SOCIAL HISTORY:  Social History   Tobacco Use  . Smoking status: Never Smoker  . Smokeless tobacco: Never Used  Substance Use Topics  . Alcohol use: No    FAMILY HISTORY:  Family History  Problem Relation Age of Onset  . Colon cancer Neg Hx     DRUG ALLERGIES:  Allergies  Allergen Reactions  . Contrast Media [Iodinated Diagnostic Agents] Other (See Comments)    Dizziness.  .  Diphenhydramine     dizzy  . Fenofibrate Other (See Comments)    Anorexia, Fatigue.  Marland Kitchen Lisinopril     cough    REVIEW OF SYSTEMS:   CONSTITUTIONAL: Had low grade fever,has fatigue and weakness.  EYES: No blurred or double vision.  EARS, NOSE, AND THROAT: No tinnitus or ear pain.  RESPIRATORY: Had cough, shortness of breath,  No wheezing or hemoptysis.  CARDIOVASCULAR: No chest pain, orthopnea, edema.  GASTROINTESTINAL: No nausea, vomiting, diarrhea or abdominal pain.  GENITOURINARY: No dysuria, hematuria.  ENDOCRINE: No polyuria, nocturia,  HEMATOLOGY: No anemia, easy bruising or bleeding SKIN: No rash or lesion. MUSCULOSKELETAL: No joint pain or arthritis.   NEUROLOGIC: No tingling, numbness, weakness.  PSYCHIATRY: No anxiety or depression.   MEDICATIONS AT HOME:  Prior to Admission medications   Medication Sig Start Date End Date Taking? Authorizing Provider  acetaminophen (TYLENOL) 500 MG tablet Take 1,000 mg by mouth daily as needed for mild pain.    Yes [provider]  apixaban (ELIQUIS) 5 MG TABS tablet Take 5 mg by mouth 2 (two) times daily.   Yes [provider]  finasteride (PROSCAR) 5 MG tablet Take 5 mg by mouth daily.   Yes [provider]  ketoconazole (NIZORAL) 2 % cream Apply 1 application topically daily as needed for irritation (Rash on leg).   Yes [provider]  levothyroxine (  SYNTHROID, LEVOTHROID) 75 MCG tablet Take 75 mcg by mouth daily. 04/10/18  Yes [provider]  MELATONIN PO Take 1 tablet by mouth at bedtime as needed (SLEEP).   Yes [provider]  metFORMIN (GLUCOPHAGE) 500 MG tablet Take 250 mg by mouth 2 (two) times daily with a meal.   Yes [provider]  metoprolol tartrate (LOPRESSOR) 100 MG tablet Take 1 tablet (100 mg total) by mouth 2 (two) times daily. 04/29/17  Yes Wieting, Richard, MD  pantoprazole (PROTONIX) 40 MG tablet Take 40 mg by mouth every other day.   Yes [provider]  QUEtiapine (SEROQUEL) 25 MG tablet Take 0.5 tablets (12.5 mg total) by mouth at bedtime. 04/29/17  Yes Wieting, Richard, MD  tamsulosin (FLOMAX) 0.4 MG CAPS capsule Take 0.4 mg by mouth daily.   Yes [provider]  levothyroxine (SYNTHROID, LEVOTHROID) 50 MCG tablet Take 1 tablet (50 mcg total) by mouth daily before breakfast. Patient not taking: Reported on 04/15/2018 04/30/17   [name], MD  magnesium oxide (MAG-OX) 400 (241.3 Mg) MG tablet Take 1 tablet (400 mg total) by mouth daily. Patient not taking: Reported on 04/15/2018 04/30/17   [name], MD      PHYSICAL EXAMINATION:   VITAL SIGNS: Blood pressure (!) 142/67, pulse 99, temperature 98.9 F (37.2 C), temperature source Oral, resp. rate (!) 28, height 5\' 9"  (1.753 m), weight 81.6 kg (180 lb), SpO2 95 %.  GENERAL:  84 y.o.-year-old patient lying in the bed with no acute distress.  EYES: Pupils equal, round, reactive to light and accommodation. No scleral icterus. Extraocular muscles intact.  HEENT: Head atraumatic, normocephalic. Oropharynx dry and nasopharynx clear.  NECK:  Supple, no jugular venous distention. No thyroid enlargement, no tenderness.  LUNGS: Decreased breath sounds bilaterally, rales heard in right lung. No use of accessory muscles of respiration.  CARDIOVASCULAR: S1, S2 normal. No murmurs, rubs, or gallops.  ABDOMEN: Soft, nontender, nondistended. Bowel sounds present. No organomegaly or mass.  EXTREMITIES: No pedal edema, cyanosis, or clubbing.  NEUROLOGIC: Cranial nerves II through XII are intact. Muscle strength 5/5 in all extremities. Sensation intact. Gait not checked.  PSYCHIATRIC: The patient is alert and oriented x 2.  SKIN: No obvious rash, lesion, or ulcer.   LABORATORY PANEL:   CBC Recent Labs  Lab 04/15/18 0931  WBC 7.9  HGB 13.3  HCT 37.9*  PLT 208  MCV 91.3  MCH 31.9  MCHC 34.9  RDW 14.0    ------------------------------------------------------------------------------------------------------------------  Chemistries  Recent Labs  Lab 04/15/18 0931  NA 135  K 3.2*  CL 102  CO2 23  GLUCOSE 199*  BUN 14  CREATININE 1.15  CALCIUM 8.4*   ------------------------------------------------------------------------------------------------------------------ estimated creatinine clearance is 45.3 mL/min (by C-G formula based on SCr of 1.15 mg/dL). ------------------------------------------------------------------------------------------------------------------ No results for input(s): TSH, T4TOTAL, T3FREE, THYROIDAB in the last 72 hours.  Invalid input(s): FREET3   Coagulation profile No results for input(s): INR, PROTIME in the last 168 hours. ------------------------------------------------------------------------------------------------------------------- No results for input(s): DDIMER in the last 72 hours. -------------------------------------------------------------------------------------------------------------------  Cardiac Enzymes Recent Labs  Lab 04/15/18 1018  TROPONINI 0.03*   ------------------------------------------------------------------------------------------------------------------ Invalid input(s): POCBNP  ---------------------------------------------------------------------------------------------------------------  Urinalysis    Component Value Date/Time   COLORURINE YELLOW (A) 04/15/2018 1121   APPEARANCEUR CLEAR (A) 04/15/2018 1121   LABSPEC 1.015 04/15/2018 1121   PHURINE 5.0 04/15/2018 1121   GLUCOSEU 150 (A) 04/15/2018 1121   HGBUR NEGATIVE 04/15/2018 1121   BILIRUBINUR NEGATIVE 04/15/2018 1121  KETONESUR NEGATIVE 04/15/2018 1121   PROTEINUR NEGATIVE 04/15/2018 1121   NITRITE NEGATIVE 04/15/2018 1121   LEUKOCYTESUR NEGATIVE 04/15/2018 1121     RADIOLOGY: Dg Chest Port 1 View  Result Date: 04/15/2018 CLINICAL DATA:  Fever.  EXAM: PORTABLE CHEST 1 VIEW COMPARISON:  None. FINDINGS: Heart size and mediastinal contours are within normal limits. Atherosclerotic changes noted at the aortic arch. There is elevation of the RIGHT hemidiaphragm, of uncertain chronicity. Platelike opacity overlies the RIGHT hemidiaphragm, atelectasis versus pneumonia. Suspect small RIGHT pleural effusion. LEFT lung is clear. IMPRESSION: 1. Elevation of the RIGHT hemidiaphragm is of uncertain chronicity. Opacity overlying the elevated RIGHT hemidiaphragm is compatible with either atelectasis or pneumonia, favor pneumonia given the history of fever. Suspect small associated RIGHT pleural effusion. 2. LEFT lung is clear. 3. Aortic atherosclerosis. Electronically Signed   By: Franki Cabot M.D.   On: 04/15/2018 10:32    EKG: Orders placed or performed during the hospital encounter of 04/15/18  . ED EKG  . ED EKG  . EKG 12-Lead  . EKG 12-Lead    IMPRESSION AND PLAN: 84 year old elderly male patient with a known history of Alzheimer's dementia, chronic atrial fibrillation, skin cancer, diabetes mellitus type 2, thyroid disease presented to the emergency room with generalized weakness, cough.  -Community-acquired pneumonia Start patient on IV Rocephin and IV Zithromax antibiotics Follow-up cultures   -sepsis secondary to pneumonia IV fluid hydration Follow-up lactic acid level  -Alzheimer's dementia Supportive care  -Diabetes mellitus type 2  diabetic diet with sliding scale coverage with insulin Resume metformin  -Ambulatory dysfunction Physical therapy evaluation  All the records are reviewed and case discussed with ED provider. Management plans discussed with the patient, family and they are in agreement.  CODE STATUS:DNR    Code Status Orders  (From admission, onward)        Start     Ordered   04/15/18 1337  Do not attempt resuscitation (DNR)  Continuous    Question Answer Comment  In the event of cardiac or respiratory  ARREST Do not call a "code blue"   In the event of cardiac or respiratory ARREST Do not perform Intubation, CPR, defibrillation or ACLS   In the event of cardiac or respiratory ARREST Use medication by any route, position, wound care, and other measures to relive pain and suffering. May use oxygen, suction and manual treatment of airway obstruction as needed for comfort.      04/15/18 1336    Code Status History    Date Active Date Inactive Code Status Order ID Comments User Context   04/28/2017 1102 04/29/2017 1635 DNR [id number]  Hillary Bow, MD ED   04/28/2017 1030 04/28/2017 1102 Full Code [id number]  Hillary Bow, MD ED    Advance Directive Documentation     Most Recent Value  Type of Advance Directive  Out of facility DNR (pink MOST or yellow form)  Pre-existing out of facility DNR order (yellow form or pink MOST form)  Yellow form placed in chart (order not valid for inpatient use)  "MOST" Form in Place?  -       TOTAL TIME TAKING CARE OF THIS PATIENT: 52 minutes.    Saundra Shelling M.D on 04/15/2018 at 1:58 PM  Between 7am to 6pm - Pager - [phone]  After 6pm go to [email or web address] - password [email or web address] [facility]  [place] [facility]  Office  [phone]  CC: Primary care physician; Kirk Ruths, MD

## 2018-04-15 NOTE — Progress Notes (Signed)
Dr Verdell Carmine notified of lactic acid 2.9. No new orders received

## 2018-04-15 NOTE — ED Notes (Signed)
Date and time results received: 04/15/18 1159   Test: lactic 2.2, troponin 0.03   Name of Provider Notified: Dr. Cherylann Banas

## 2018-04-15 NOTE — ED Triage Notes (Signed)
Found on bathroom floor by wife this morning.  patient states he felt weak and fell to knees this morning.  Patient has been feeling generally weak for about one week.  EMS report a temperature of 100.5.  Patient also has had a productive cough.    18ga LAC-- 400 ml NS given PTA.  CBG  201

## 2018-04-15 NOTE — Progress Notes (Signed)
Advanced care plan. Purpose of the Encounter: CODE STATUS Parties in Attendance: Patient and family Patient's Decision Capacity: Good Subjective/Patient's story: Presented with shortness of breath, cough Objective/Medical story Has pneumonia Needs IV antibiotics Goals of care determination:  Advance care directives discussed with the patient and goals of care discussed Patient and family do not want any cardiac resuscitation, intubation and ventilator if the need arises Goals met CODE STATUS: DNR Time spent discussing advanced care planning: 16 minutes

## 2018-04-16 LAB — BASIC METABOLIC PANEL
Anion gap: 12 (ref 5–15)
BUN: 11 mg/dL (ref 6–20)
CALCIUM: 8.2 mg/dL — AB (ref 8.9–10.3)
CO2: 20 mmol/L — ABNORMAL LOW (ref 22–32)
Chloride: 106 mmol/L (ref 101–111)
Creatinine, Ser: 0.83 mg/dL (ref 0.61–1.24)
GFR calc Af Amer: >60 mL/min (ref >60)
Glucose, Bld: 176 mg/dL — ABNORMAL HIGH (ref 65–99)
Potassium: 3.5 mmol/L (ref 3.5–5.1)
Sodium: 138 mmol/L (ref 135–145)

## 2018-04-16 LAB — CBC
HCT: 39.3 % — ABNORMAL LOW (ref 40.0–52.0)
Hemoglobin: 13.8 g/dL (ref 13.0–18.0)
MCH: 32.2 pg (ref 26.0–34.0)
MCHC: 35.1 g/dL (ref 32.0–36.0)
MCV: 91.6 fL (ref 80.0–100.0)
Platelets: 243 10*3/uL (ref 150–440)
RBC: 4.29 MIL/uL — ABNORMAL LOW (ref 4.40–5.90)
RDW: 13.6 % (ref 11.5–14.5)
WBC: 8.9 10*3/uL (ref 3.8–10.6)

## 2018-04-16 LAB — GLUCOSE, CAPILLARY
GLUCOSE-CAPILLARY: 131 mg/dL — AB (ref 65–99)
Glucose-Capillary: 183 mg/dL — ABNORMAL HIGH (ref 65–99)
Glucose-Capillary: 153 mg/dL — ABNORMAL HIGH (ref 65–99)
Glucose-Capillary: 151 mg/dL — ABNORMAL HIGH (ref 65–99)

## 2018-04-16 MED ORDER — IBUPROFEN 400 MG PO TABS
400.0000 mg | ORAL_TABLET | Freq: Four times a day (QID) | ORAL | Status: DC | PRN
  Administered 2018-04-16: 400 mg via ORAL
  Filled 2018-04-16: qty 1

## 2018-04-16 MED ORDER — IPRATROPIUM-ALBUTEROL 0.5-2.5 (3) MG/3ML IN SOLN: 3.0000 mL | Freq: Four times a day (QID) | RESPIRATORY_TRACT | Status: DC

## 2018-04-16 MED ORDER — IPRATROPIUM-ALBUTEROL 0.5-2.5 (3) MG/3ML IN SOLN
3.0000 mL | Freq: Four times a day (QID) | RESPIRATORY_TRACT | Status: DC | PRN
  Administered 2018-04-16 – 2018-04-18 (×3): 3 mL via RESPIRATORY_TRACT
  Filled 2018-04-16 (×3): qty 3

## 2018-04-16 NOTE — Plan of Care (Signed)

## 2018-04-16 NOTE — Progress Notes (Signed)
PT Cancellation Note  Patient Details Name: Zachary Serrano MRN: [id number] DOB: Jun 21, 1928   Cancelled Treatment:    Reason Eval/Treat Not Completed: Patient's level of consciousness.  Order received.  Chart reviewed.  Pt in bed and appearing lethargic upon PT arrival.  Repeats "okay" when asked to participate in supine strength screen and to move to EOB with assistance but does not initiate movement when appropriate.  Will re-attempt later if time allows to see if pt is more appropriate.   Roxanne Gates, PT, DPT 04/16/2018, 8:41 AM

## 2018-04-16 NOTE — Progress Notes (Signed)
[place] at [place] NAME: Kevyn Boquet    MR#:  [id number]  DATE OF BIRTH:  [birth year]  SUBJECTIVE: Generalized weakness, cough, high fever found to have pneumonia.  Patient still spiking 102 F  This morning.  CHIEF COMPLAINT:   Chief Complaint  Patient presents with  . Weakness  appears to have delerium  With baseline dementia.denies any complaints.  REVIEW OF SYSTEMS:   ROS CONSTITUTIONAL: High fever, shortness of breath, cough.  Confused but able to answer questions appropriately. EYES: No blurred or double vision.  EARS, NOSE, AND THROAT: No tinnitus or ear pain.  RESPIRATORY: No cough, shortness of breath, wheezing or hemoptysis.  CARDIOVASCULAR: No chest pain, orthopnea, edema.  GASTROINTESTINAL: No nausea, vomiting, diarrhea or abdominal pain.  GENITOURINARY: No dysuria, hematuria.  ENDOCRINE: No polyuria, nocturia,  HEMATOLOGY: No anemia, easy bruising or bleeding SKIN: No rash or lesion. MUSCULOSKELETAL: No joint pain or arthritis.   NEUROLOGIC: No tingling, numbness, weakness.  PSYCHIATRY: No anxiety or depression.   DRUG ALLERGIES:   Allergies  Allergen Reactions  . Contrast Media [Iodinated Diagnostic Agents] Other (See Comments)    Dizziness.  . Diphenhydramine     dizzy  . Fenofibrate Other (See Comments)    Anorexia, Fatigue.  Marland Kitchen Lisinopril     cough    VITALS:  Blood pressure (!) 161/94, pulse (!) 128, temperature 98.9 F (37.2 C), temperature source Oral, resp. rate (!) 22, height 5\' 9"  (1.753 m), weight 81.6 kg (180 lb), SpO2 94 %.  PHYSICAL EXAMINATION:  GENERAL:  84 y.o.-year-old patient lying in the bed with no acute distress.  EYES: Pupils equal, round, reactive to light  accommodation. No scleral icterus. Extraocular muscles intact.  HEENT: Head atraumatic, normocephalic. Oropharynx and nasopharynx clear.  NECK:  Supple, no jugular venous distention. No thyroid enlargement, no tenderness.   LUNGS: Decreased breath sounds bilaterally cARDIOVASCULAR: S1, S2 normal. No murmurs, rubs, or gallops.  ABDOMEN: Soft, nontender, nondistended. Bowel sounds present. No organomegaly or mass.  EXTREMITIES: No pedal edema, cyanosis, or clubbing.  NEUROLOGIC: unable to do full neuro exam,appears to be delerious. Sensation intact. Gait not checked.  PSYCHIATRIC: The patient is alert and oriented x 3.  SKIN: No obvious rash, lesion, or ulcer.    LABORATORY PANEL:   CBC Recent Labs  Lab 04/16/18 0543  WBC 8.9  HGB 13.8  HCT 39.3*  PLT 243   ------------------------------------------------------------------------------------------------------------------  Chemistries  Recent Labs  Lab 04/16/18 0543  NA 138  K 3.5  CL 106  CO2 20*  GLUCOSE 176*  BUN 11  CREATININE 0.83  CALCIUM 8.2*   ------------------------------------------------------------------------------------------------------------------  Cardiac Enzymes Recent Labs  Lab 04/15/18 1018  TROPONINI 0.03*   ------------------------------------------------------------------------------------------------------------------  RADIOLOGY:  Dg Chest Port 1 View  Result Date: 04/15/2018 CLINICAL DATA:  Fever. EXAM: PORTABLE CHEST 1 VIEW COMPARISON:  None. FINDINGS: Heart size and mediastinal contours are within normal limits. Atherosclerotic changes noted at the aortic arch. There is elevation of the RIGHT hemidiaphragm, of uncertain chronicity. Platelike opacity overlies the RIGHT hemidiaphragm, atelectasis versus pneumonia. Suspect small RIGHT pleural effusion. LEFT lung is clear. IMPRESSION: 1. Elevation of the RIGHT hemidiaphragm is of uncertain chronicity. Opacity overlying the elevated RIGHT hemidiaphragm is compatible with either atelectasis or pneumonia, favor pneumonia given the history of fever. Suspect small associated RIGHT pleural effusion. 2. LEFT lung is clear. 3. Aortic atherosclerosis. Electronically Signed    By: Franki Cabot M.D.   On: 04/15/2018 10:32  EKG:   Orders placed or performed during the hospital encounter of 04/15/18  . ED EKG  . ED EKG  . EKG 12-Lead  . EKG 12-Lead    ASSESSMENT AND PLAN:   Sepsis present on admission secondary to pneumonia, patient has community-acquired pneumonia: Continue Rocephin, Zithromax, IV fluids.,  Lactic acid level is slightly up today.  Continue IV fluids. #2 chronic atrial fibrillation 3.  Late onset Alzheimer's dementia without behavioral disturbances: Patient is on BuSpar, Exelon patch 4.  Chronic A. fib.  Patient is on apixaban, metoprolol  #5Diabteesmellitus; type II: Continue sliding scale with coverage, hold metformin secondary to lactic acidosis.  Continue IV antibiotics, IV fluids, follow blood cultures, follow clinical course. All the records are reviewed and case discussed with Care Management/Social Workerr. Management plans discussed with the patient, family and they are in agreement.  CODE STATUS: DNR  TOTAL TIME TAKING CARE OF THIS PATIENT: 35 minutes.   POSSIBLE D/C IN 1-2 DAYS, DEPENDING ON CLINICAL CONDITION.   Epifanio Lesches M.D on 04/16/2018 at 11:38 AM  Between 7am to 6pm - Pager - [phone]  After 6pm go to [email or web address] - password [email or web address] [facility]  [place] [facility]  Office  [phone]  CC: Primary care physician; Kirk Ruths, MD Patient admitted for  Note: This dictation was prepared with Dragon dictation along with smaller phrase technology. Any transcriptional errors that result from this process are unintentional.

## 2018-04-17 DIAGNOSIS — L8990 Pressure ulcer of unspecified site, unspecified stage: Secondary | ICD-10-CM

## 2018-04-17 LAB — GLUCOSE, CAPILLARY
GLUCOSE-CAPILLARY: 111 mg/dL — AB (ref 65–99)
Glucose-Capillary: 145 mg/dL — ABNORMAL HIGH (ref 65–99)
Glucose-Capillary: 139 mg/dL — ABNORMAL HIGH (ref 65–99)
Glucose-Capillary: 128 mg/dL — ABNORMAL HIGH (ref 65–99)

## 2018-04-17 LAB — LACTIC ACID, PLASMA: Lactic Acid, Venous: 1.5 mmol/L (ref 0.5–1.9)

## 2018-04-17 MED ORDER — MELATONIN 5 MG PO TABS
5.0000 mg | ORAL_TABLET | Freq: Every evening | ORAL | Status: DC | PRN
  Administered 2018-04-17: 5 mg via ORAL
  Filled 2018-04-17: qty 1

## 2018-04-17 NOTE — Progress Notes (Signed)
[place] at [place] NAME: Zachary Serrano    MR#:  [id number]  DATE OF BIRTH:  05-22-1928  SUBJECTIVE: Admitted for pneumonia.  Patient afebrile today, has baseline confusion, dementia.  Wife mentioned that he is falling at home, he cannot take care of him at home, requesting rehab.  CHIEF COMPLAINT:   Chief Complaint  Patient presents with  . Weakness  appears to have delerium  With baseline dementia.denies any complaints.  REVIEW OF SYSTEMS:   ROS Dementia, noted to have some cough,.  Unable to obtain review of systems because of dementia.  DRUG ALLERGIES:   Allergies  Allergen Reactions  . Contrast Media [Iodinated Diagnostic Agents] Other (See Comments)    Dizziness.  . Diphenhydramine     dizzy  . Fenofibrate Other (See Comments)    Anorexia, Fatigue.  Marland Kitchen Lisinopril     cough    VITALS:  Blood pressure (!) 165/86, pulse 100, temperature 98.9 F (37.2 C), temperature source Oral, resp. rate 20, height 5\' 9"  (1.753 m), weight 81.6 kg (180 lb), SpO2 96 %.  PHYSICAL EXAMINATION:  GENERAL:  84 y.o.-year-old patient sitting in chair and trying to pull bedsheets.  EYES: Pupils equal, round, reactive to light  accommodation. No scleral icterus. Extraocular muscles intact.  HEENT: Head atraumatic, normocephalic. Oropharynx and nasopharynx clear.  NECK:  Supple, no jugular venous distention. No thyroid enlargement, no tenderness.  LUNGS: Decreased breath sounds bilaterally cARDIOVASCULAR: S1, S2 normal. No murmurs, rubs, or gallops.  ABDOMEN: Soft, nontender, nondistended. Bowel sounds present. No organomegaly or mass.  EXTREMITIES: No pedal edema, cyanosis, or clubbing.  NEUROLOGIC: unable to do full neuro exam,appears to be delerious. Sensation intact. Gait not checked.  PSYCHIATRIC: The patient is alert and oriented x 3.  SKIN: No obvious rash, lesion, or ulcer.    LABORATORY PANEL:   CBC Recent Labs  Lab  04/16/18 0543  WBC 8.9  HGB 13.8  HCT 39.3*  PLT 243   ------------------------------------------------------------------------------------------------------------------  Chemistries  Recent Labs  Lab 04/16/18 0543  NA 138  K 3.5  CL 106  CO2 20*  GLUCOSE 176*  BUN 11  CREATININE 0.83  CALCIUM 8.2*   ------------------------------------------------------------------------------------------------------------------  Cardiac Enzymes Recent Labs  Lab 04/15/18 1018  TROPONINI 0.03*   ------------------------------------------------------------------------------------------------------------------  RADIOLOGY:  No results found.  EKG:   Orders placed or performed during the hospital encounter of 04/15/18  . ED EKG  . ED EKG  . EKG 12-Lead  . EKG 12-Lead    ASSESSMENT AND PLAN:   Sepsis present on admission secondary to pneumonia, pienthascommunity-acquired pneumonia: Continue Rocephin, Zithromax, IV fluids.,  Trend lactic acid.  P.o. intake  #2 chronic atrial fibrillation: Rate slightly up, adjust her dose of metoprolol.  3.  Late onset Alzheimer's dementia without behavioral disturbances: Patient is on BuSpar, Exelon patch, wife mentioned that he is falling at home, he cannot take care of him at home and requesting placement . 4.  Chronic A. fib.  Patient is on apixaban, metoprolol  #5,Diabteesmellitus; type II: Continue sliding scale with coverage, hold metformin secondary to lactic acidosis. Blood sugars overall controlled.  All the records are reviewed and case discussed with Care Management/Social Workerr. Management plans discussed with the patient, family and they are in agreement.  CODE STATUS: DNR  TOTAL TIME TAKING CARE OF THIS PATIENT: 82 minutes.   POSSIBLE D/C IN 1-2 DAYS, DEPENDING ON CLINICAL CONDITION.   Epifanio Lesches M.D on 04/17/2018 at 11:24  AM  Between 7am to 6pm - Pager - [phone]  After 6pm go to [email or web address] - password  [email or web address] [facility]  [place] [facility]  Office  [phone]  CC: Primary care physician; Kirk Ruths, MD Patient admitted for  Note: This dictation was prepared with Dragon dictation along with smaller phrase technology. Any transcriptional errors that result from this process are unintentional.

## 2018-04-17 NOTE — Clinical Social Work Note (Signed)
Clinical Social Work Assessment  Patient Details  Name: Zachary Serrano MRN: [id number] Date of Birth: 25-May-1928  Date of referral:  04/17/18               Reason for consult:  Facility Placement, Discharge Planning                Permission sought to share information with:    Permission granted to share information::     Name::        Agency::     Relationship::     Contact Information:     Housing/Transportation Living arrangements for the past 2 months:  [place] of Information:  Adult Children, Spouse Patient Interpreter Needed:  None Criminal Activity/Legal Involvement Pertinent to Current Situation/Hospitalization:  No - Comment as needed Significant Relationships:  Adult Children, Spouse Lives with:  Spouse Do you feel safe going back to the place where you live?  Yes Need for family participation in patient care:  Yes (Comment)  Care giving concerns:  Patient and his wife Chrys Racer live at [facility] in [place].    Social Worker assessment / plan:  Holiday representative (CSW) received verbal consult from RN that patient's wife is requesting placement. PT is recommending home health 24/7 supervision. CSW met with patient and his wife Chrys Racer was at bedside. Patient was pleasantly confused and was laying in the bed. CSW introduced self and explained role of CSW department. Per wife they live at [facility] and she requested placement. CSW explained to wife that patient does not qualify for short term rehab at a SNF because his mobility is good and PT is recommending home health. CSW explained private pay options for SNF vs. Long term care medicaid. Wife reported that can't pay privately. Wife accepted the home health. Per wife patient is affiliated with the [place] and has a caregiver come out every morning Monday-Friday get him up and dressed. Per wife the [place] pays for that caregiver. Wife requested that CSW call her son Delfino Lovett and  make him aware of above.   CSW contacted patient's son Delfino Lovett and made him aware of above. Richard requested that CSW give wife a Careers information officer so they can private pay for someone to stay with patient at night. Sitter list was given to wife. CSW explained to [employer] pay placement options and how to apply for long term care medicaid. Per Richard patient has "too many liquid assets" to qualify for medicaid right away. Wife reported that they can't pay privately for placement because they are paying for [facility]. Wife and son are agreeable to home health. RN case manager aware of above.   Employment status:  Retired Nurse, adult PT Recommendations:  Home with [place], [place] / Referral to community resources:  Other (Comment Required)(Patient will D/C home with home health. )  Patient/Family's Response to care:  Patient's wife and son are agreeable for patient to D/C home.   Patient/Family's Understanding of and Emotional Response to Diagnosis, Current Treatment, and Prognosis:  Patient's wife and son were very pleasant and thanked CSW for assistance.   Emotional Assessment Appearance:  Appears stated age Attitude/Demeanor/Rapport:    Affect (typically observed):  Pleasant Orientation:  Oriented to Self, Oriented to Place, Fluctuating Orientation (Suspected and/or reported Sundowners) Alcohol / Substance use:  Not Applicable Psych involvement (Current and /or in the community):  No (Comment)  Discharge Needs  Concerns to  be addressed:  Discharge Planning Concerns Readmission within the last 30 days:  No Current discharge risk:  Chronically ill, Cognitively Impaired Barriers to Discharge:  Continued Medical Work up   ,  M, LCSW 04/17/2018, 2:45 PM  

## 2018-04-17 NOTE — Progress Notes (Signed)
Chaplain was rounding and nurses asked Chaplain to visit Pt. Pt is a confused and wanted to go to church. Chaplain asked Pt what he wasn't from church and he said he loved church. Chaplain asked what was his favorite scripture. The Pt was unable to answer. Chaplain recited John 3 :16 and 23rd psalm and talked a little about them. Pt was thankful and asked  Chaplain to come back.    04/17/18 1500  Clinical Encounter Type  Visited With Patient  Visit Type Initial;Spiritual support  Referral From Nurse  Spiritual Encounters  Spiritual Needs Prayer;Emotional

## 2018-04-17 NOTE — Evaluation (Signed)
Physical Therapy Evaluation Patient Details Name: Zachary Serrano MRN: [id number] DOB: 1927/12/30 Today's Date: 04/17/2018   History of Present Illness  Pt is an 84 y.o. male admitted 04/15/18 for a fall, admitting diagnosis of pneumonia. PMH includes hypothyroidism, a-fib, hypertension, Alzheimer's, diabetes, and skin cancer  Clinical Impression  Prior to hospital admission, pt reports to be independent w/ transfers and ambulation.  Pt lives in an independent living facility; pt reports that spouse left him, no family present to verify information. Some confusion noted during session. Currently pt is minimum assist for supine to sit bed mobility, contact guard when performing sit to/from stand transfers.  Verbal and tactile cues given to assist with UE placement and technique for transfers and bed mobility. Pt ambulated w/ RW 200 ft total w/ contact guard. Arms noted to be mildly shaking from increased effort needed to ambulate, shortness of breath after finishing walk that went away with rest in chair. Pt would benefit from skilled PT to address noted impairments and functional limitations (see below for any additional details).  Upon hospital discharge, recommend pt discharge to previous independent living facility with home health PT and 24/7 supervision/assistance for safety.  SaO2 on room air at rest = 90% w/ semi-supine, 94% sitting edge of bed SaO2 on room air after ambulating = 93%   Follow Up Recommendations Home health PT;Supervision/Assistance - 24 hour    Equipment Recommendations  Rolling walker with 5" wheels    Recommendations for Other Services OT consult     Precautions / Restrictions Precautions Precautions: Fall Restrictions Weight Bearing Restrictions: No      Mobility  Bed Mobility Overal bed mobility: Needs Assistance Bed Mobility: Supine to Sit     Supine to sit: Min assist;HOB elevated     General bed mobility comments: Pt required vc's to assist with UE  placement, tactile cues for LE movement, vc's and tactile cues to scoot to edge of bed using B UE.  Transfers Overall transfer level: Needs assistance Equipment used: Rolling walker (2 wheeled) Transfers: Sit to/from Stand Sit to Stand: Min guard         General transfer comment: Pt required vc's and tactile cues for UE placement and transfer technique  Ambulation/Gait Ambulation/Gait assistance: Min guard Ambulation Distance (Feet): 200 Feet Assistive device: Rolling walker (2 wheeled) Gait Pattern/deviations: Step-through pattern;WFL(Within Functional Limits) Gait velocity: decreased   General Gait Details: Pt demonstrating shortness of breath halfway through walk, increased use of B UE noted from mild shaking of elbows while ambulating at 60 feet mark  Stairs            Wheelchair Mobility    Modified Rankin (Stroke Patients Only)       Balance Overall balance assessment: Needs assistance Sitting-balance support: Bilateral upper extremity supported Sitting balance-Leahy Scale: Fair Sitting balance - Comments: Pt tends to lean backwards when on edge of bed, no loss of balance noted Postural control: Posterior lean Standing balance support: Bilateral upper extremity supported Standing balance-Leahy Scale: Fair Standing balance comment: Pt can shift weight while standing, contact guard provided for safety                             Pertinent Vitals/Pain Pain Assessment: No/denies pain    Home Living Family/patient expects to be discharged to:: Private residence Living Arrangements: Other (Comment)(pt reports that his spouse left him)  Prior Function Level of Independence: Independent(pt reports transfers and ambulation w/out AD prior to hospitalization; pt reports to have experience with AD)               Hand Dominance        Extremity/Trunk Assessment   Upper Extremity Assessment Upper Extremity Assessment:  Overall WFL for tasks assessed    Lower Extremity Assessment Lower Extremity Assessment: Overall WFL for tasks assessed(Pt was at 4/5 B hip flexion, 4/5 B knee flexion, 4/5 B knee extension, 4+/5 B dorsiflexion, 4-/5 B plantarflexion)    Cervical / Trunk Assessment Cervical / Trunk Assessment: Normal  Communication      Cognition Arousal/Alertness: Awake/alert Behavior During Therapy: WFL for tasks assessed/performed Overall Cognitive Status: No family/caregiver present to determine baseline cognitive functioning(Pt has Alzheimer's.) Area of Impairment: Safety/judgement;Following commands;Memory;Orientation                 Orientation Level: (Oriented to person, place, birthday. Confusion regarding previous living conditions)   Memory: Decreased short-term memory Following Commands: Follows one step commands consistently Safety/Judgement: Decreased awareness of safety            General Comments      Exercises     Assessment/Plan    PT Assessment Patient needs continued PT services  PT Problem List Decreased strength;Decreased activity tolerance;Decreased balance;Decreased mobility;Decreased cognition;Decreased safety awareness;Decreased knowledge of precautions;Cardiopulmonary status limiting activity       PT Treatment Interventions Gait training;Functional mobility training;Therapeutic activities;Therapeutic exercise;Balance training;Patient/family education    PT Goals (Current goals can be found in the Care Plan section)  Acute Rehab PT Goals Patient Stated Goal: to improve mobility PT Goal Formulation: With patient Time For Goal Achievement: 05/01/18 Potential to Achieve Goals: Good    Frequency Min 2X/week   Barriers to discharge        Co-evaluation               AM-PAC PT "6 Clicks" Daily Activity  Outcome Measure Difficulty turning over in bed (including adjusting bedclothes, sheets and blankets)?: A Lot Difficulty moving from lying on  back to sitting on the side of the bed? : Unable Difficulty sitting down on and standing up from a chair with arms (e.g., wheelchair, bedside commode, etc,.)?: Unable Help needed moving to and from a bed to chair (including a wheelchair)?: A Little Help needed walking in hospital room?: A Little Help needed climbing 3-5 steps with a railing? : A Lot 6 Click Score: 12    End of Session Equipment Utilized During Treatment: Gait belt Activity Tolerance: Patient tolerated treatment well;Patient limited by fatigue Patient left: in chair;with chair alarm set;with nursing/sitter in room;with call bell/phone within reach;Other (comment)(pt left in chair with phone to contact nursing; nursing notified of missing call bell; fall mat placed ) Nurse Communication: Mobility status;Precautions PT Visit Diagnosis: Other abnormalities of gait and mobility (R26.89);History of falling (Z91.81);Difficulty in walking, not elsewhere classified (R26.2);Muscle weakness (generalized) (M62.81)    Time: 9892-1194 PT Time Calculation (min) (ACUTE ONLY): 42 min   Charges:         PT G CodesChana Bode, SPT 04/17/18, 1:15 PM

## 2018-04-18 LAB — GLUCOSE, CAPILLARY
GLUCOSE-CAPILLARY: 149 mg/dL — AB (ref 65–99)
Glucose-Capillary: 106 mg/dL — ABNORMAL HIGH (ref 65–99)
Glucose-Capillary: 125 mg/dL — ABNORMAL HIGH (ref 65–99)
Glucose-Capillary: 109 mg/dL — ABNORMAL HIGH (ref 65–99)

## 2018-04-18 MED ORDER — AMOXICILLIN-POT CLAVULANATE 875-125 MG PO TABS: 1.0000 | ORAL_TABLET | Freq: Two times a day (BID) | ORAL | 0 refills | Status: DC

## 2018-04-18 MED ORDER — AZITHROMYCIN 500 MG PO TABS
500.0000 mg | ORAL_TABLET | Freq: Every day | ORAL | Status: DC
  Administered 2018-04-19: 500 mg via ORAL
  Filled 2018-04-18: qty 1

## 2018-04-18 NOTE — Care Management (Addendum)
This RNCM received call from covering [facility] requesting service recovery on  patient's Code 44 today. RNCM received notification today that patient no longer met criteria for inpatient stay per [facility] denial per [facility]. RNCM reached out to son Saliou Barnier [phone]. I explained that [facility] found out this at time covering RNCM contacted him with Code 44 status.  I explained that it was based on [facility] review and they have declined inpatient stay. Patient's wife, per son, was on the phone with Sherre Poot at time of my call. Copy of MOON/code 44 left at patient's bedside. Son appreciated that explanation and callback.

## 2018-04-18 NOTE — Care Management Important Message (Signed)
Copy of signed IM left with patient and wife in room.  

## 2018-04-18 NOTE — Progress Notes (Signed)
PHARMACIST - PHYSICIAN COMMUNICATION  CONCERNING: Antibiotic IV to Oral Route Change Policy  RECOMMENDATION: This patient is receiving Azithromycin by the intravenous route.  Based on criteria approved by the Pharmacy and Therapeutics Committee, the antibiotic(s) is/are being converted to the equivalent oral dose form(s).   DESCRIPTION: These criteria include:  Patient being treated for a respiratory tract infection, urinary tract infection, cellulitis or clostridium difficile associated diarrhea if on metronidazole  The patient is not neutropenic and does not exhibit a GI malabsorption state  The patient is eating (either orally or via tube) and/or has been taking other orally administered medications for a least 24 hours  The patient is improving clinically and has a Tmax < 100.5  If you have questions about this conversion, please contact the Pharmacy Department  []   [phone] )  [facility] [x]   [phone] )  [facility] []   [phone] )  [facility] []   [phone] )  [facility] []   [phone] )  [place] PharmD Clinical Pharmacist 04/18/2018

## 2018-04-18 NOTE — Progress Notes (Signed)
[facility] [place] NAME: Zachary Serrano    MR#:  [id number]  DATE OF BIRTH:  September 21, 1928  SUBJECTIVE: Admitted for pneumonia.  Patient afebrile today, has baseline confusion, dementia.  Patient has slight wheezing today  CHIEF COMPLAINT:   Chief Complaint  Patient presents with  . Weakness  Appears comfortable.  REVIEW OF SYSTEMS:   ROS Dementia, noted to have some cough,.  Unable to obtain review of systems because of dementia.  DRUG ALLERGIES:   Allergies  Allergen Reactions  . Contrast Media [Iodinated Diagnostic Agents] Other (See Comments)    Dizziness.  . Diphenhydramine     dizzy  . Fenofibrate Other (See Comments)    Anorexia, Fatigue.  Marland Kitchen Lisinopril     cough    VITALS:  Blood pressure (!) 148/82, pulse 84, temperature 98.3 F (36.8 C), temperature source Oral, resp. rate 20, height 5\' 9"  (1.753 m), weight 81.6 kg (180 lb), SpO2 94 %.  PHYSICAL EXAMINATION:  GENERAL:  84 y.o.-year-old patient sitting in chair and trying to pull bedsheets.  EYES: Pupils equal, round, reactive to light  accommodation. No scleral icterus. Extraocular muscles intact.  HEENT: Head atraumatic, normocephalic. Oropharynx and nasopharynx clear.  NECK:  Supple, no jugular venous distention. No thyroid enlargement, no tenderness.  LUNGS: Decreased breath sounds bilaterally, faint expiratory wheeze present at bases ,  cARDIOVASCULAR: S1, S2 normal. No murmurs, rubs, or gallops.  ABDOMEN: Soft, nontender, nondistended. Bowel sounds present. No organomegaly or mass.  EXTREMITIES: No pedal edema, cyanosis, or clubbing.  NEUROLOGIC: unable to do full neuro exam,appears to be delerious. Sensation intact. Gait not checked.  PSYCHIATRIC: The patient is alert and oriented x 3.  SKIN: No obvious rash, lesion, or ulcer.    LABORATORY PANEL:   CBC Recent Labs  Lab 04/16/18 0543  WBC 8.9  HGB 13.8  HCT 39.3*  PLT 243    ------------------------------------------------------------------------------------------------------------------  Chemistries  Recent Labs  Lab 04/16/18 0543  NA 138  K 3.5  CL 106  CO2 20*  GLUCOSE 176*  BUN 11  CREATININE 0.83  CALCIUM 8.2*   ------------------------------------------------------------------------------------------------------------------  Cardiac Enzymes Recent Labs  Lab 04/15/18 1018  TROPONINI 0.03*   ------------------------------------------------------------------------------------------------------------------  RADIOLOGY:  No results found.  EKG:   Orders placed or performed during the hospital encounter of 04/15/18  . ED EKG  . ED EKG  . EKG 12-Lead  . EKG 12-Lead    ASSESSMENT AND PLAN:   Sepsis present on admission secondary to pneumonia, ommunity-acquired pneumonia: Continue Rocephin, Zithromax, feeling better, likely discharge home tomorrow with home health, Augmentin.     #2 chronic atrial fibrillation continue metoprolol, apixaban..  3.  Late onset Alzheimer's dementia without behavioral disturbances: Patient is on BuSpar, Exelon patch, wife mentioned that he is falling at home, he cannot take care of him at home and requesting placement but patient walked with physical therapy, unfortunately patient cannot go to rehab because of this.  He is to go home with home health physical therapy.  Discussed this with patients wife., likely discharge tomorrow. . 4.  Chronic A. fib.  Patient is on apixaban, metoprolol  #5,Diabteesmellitus; type II: Continue sliding scale with coverage, resume metformin 6.  Likely discharge tomorrow home with home health PT.  All the records are reviewed and case discussed with Care Management/Social Workerr. Management plans discussed with the patient, family and they are in agreement.  CODE STATUS: DNR  TOTAL TIME TAKING CARE OF THIS  PATIENT: 82 minutes.   POSSIBLE D/C IN 1-2 DAYS, DEPENDING ON  CLINICAL CONDITION.   Epifanio Lesches M.D on 04/18/2018 at 12:33 PM  Between 7am to 6pm - Pager - [phone]  After 6pm go to [email or web address] - password [email or web address]  [facility]  Office  [phone]  CC: Primary care physician; Kirk Ruths, MD Patient admitted for  Note: This dictation was prepared with Dragon dictation along with smaller phrase technology. Any transcriptional errors that result from this process are unintentional.

## 2018-04-18 NOTE — Care Management CC44 (Signed)
Condition Code 44 Documentation Completed  Patient Details  Name: Zachary Serrano MRN: [id number] Date of Birth: February 25, 1928   Condition Code 44 given:  Yes Patient signature on Condition Code 44 notice:  (son did not agree to sign) Documentation of 2 MD's agreement:  Yes Code 44 added to claim:  Yes    Jolly Mango, RN 04/18/2018, 3:32 PM

## 2018-04-18 NOTE — Care Management (Signed)
Attempted to call wife with no answer. No family in room. Will wait until they arrive later today to discuss discharge plan.

## 2018-04-18 NOTE — Care Management Obs Status (Signed)
[facility] NOTIFICATION   Patient Details  Name: Zachary Serrano MRN: [id number] Date of Birth: September 14, 1928   Medicare Observation Status Notification Given:  Yes    Jolly Mango, RN 04/18/2018, 3:32 PM

## 2018-04-18 NOTE — Progress Notes (Signed)
Physical Therapy Treatment Patient Details Name: Zachary Serrano MRN: [id number] DOB: [birth year] Today's Date: 04/18/2018    History of Present Illness Pt is an 84 y.o. male admitted 04/15/18 for a fall, admitting diagnosis of pneumonia. PMH includes hypothyroidism, a-fib, hypertension, Alzheimer's, diabetes, and skin cancer    PT Comments    Pt with some wheezing noted in bed.  Wife stated it started when he was moving with nursing.  Sats on room air 92%.  To edge of bed with min a x 1.  Some difficulty scooting to edge of bed.  Stood with min assist and was able to ambulate around nursing unit x 1 with walker and min guard/assist for turning walker in tight spaces in room.  Breathing improved with gait and sats 98% upon return to chair with no audible wheezing noted.  Pt's wife in attendance for session.  Stated gait is near baseline.  Encouraged +1 assist with mobility and she voiced understanding.   Follow Up Recommendations  Home health PT;Supervision/Assistance - 24 hour     Equipment Recommendations       Recommendations for Other Services       Precautions / Restrictions Precautions Precautions: Fall Restrictions Weight Bearing Restrictions: No    Mobility  Bed Mobility Overal bed mobility: Needs Assistance Bed Mobility: Supine to Sit     Supine to sit: Min assist;HOB elevated        Transfers Overall transfer level: Needs assistance Equipment used: Rolling walker (2 wheeled) Transfers: Sit to/from Stand Sit to Stand: Min guard;Min assist            Ambulation/Gait Ambulation/Gait assistance: Min guard Ambulation Distance (Feet): 200 Feet Assistive device: Rolling walker (2 wheeled)           Stairs             Wheelchair Mobility    Modified Rankin (Stroke Patients Only)       Balance Overall balance assessment: Needs assistance Sitting-balance support: Bilateral upper extremity supported Sitting balance-Leahy Scale: Fair Sitting  balance - Comments: Pt tends to lean backwards when on edge of bed, no loss of balance noted Postural control: Posterior lean Standing balance support: Bilateral upper extremity supported Standing balance-Leahy Scale: Fair Standing balance comment: Pt can shift weight while standing, contact guard provided for safety                            Cognition Arousal/Alertness: Awake/alert Behavior During Therapy: WFL for tasks assessed/performed Overall Cognitive Status: Within Functional Limits for tasks assessed                                        Exercises      General Comments        Pertinent Vitals/Pain Pain Assessment: Faces Faces Pain Scale: Hurts a little bit Pain Location: initially denies pain then reports some pain in left knee with WB and gait Pain Descriptors / Indicators: Sore Pain Intervention(s): Limited activity within patient's tolerance;Monitored during session    Home Living                      Prior Function            PT Goals (current goals can now be found in the care plan section) Progress towards PT goals: Progressing toward goals  Frequency    Min 2X/week      PT Plan Current plan remains appropriate    Co-evaluation              AM-PAC PT "6 Clicks" Daily Activity  Outcome Measure  Difficulty turning over in bed (including adjusting bedclothes, sheets and blankets)?: A Lot Difficulty moving from lying on back to sitting on the side of the bed? : Unable Difficulty sitting down on and standing up from a chair with arms (e.g., wheelchair, bedside commode, etc,.)?: Unable Help needed moving to and from a bed to chair (including a wheelchair)?: A Little Help needed walking in hospital room?: A Little Help needed climbing 3-5 steps with a railing? : A Lot 6 Click Score: 12    End of Session Equipment Utilized During Treatment: Gait belt Activity Tolerance: Patient tolerated treatment  well;Patient limited by fatigue Patient left: in chair;with chair alarm set;with call bell/phone within reach;with family/visitor present         Time: 9396-8864 PT Time Calculation (min) (ACUTE ONLY): 15 min  Charges:  $Gait Training: 8-22 mins                    G Codes:       Chesley Noon, PTA 04/18/18, 11:56 AM

## 2018-04-18 NOTE — Care Management Note (Signed)
Case Management Note  Patient Details  Name: Zachary Serrano MRN: [id number] Date of Birth: [birth year]  Subjective/Objective:  Met with son at bedside to discuss discharge planning. Patient will be discharging home with wife tomorrow. He will need RN and PT. Son states patient will have private sitters at night. Son will be helping during the day. He has a walker and a bsc. Offered a list of home health agencies. [facility] chose [facility]. Referral to [facility] with [facility] for RN and PT. PCP is M. Anderson.                    Action/Plan:   Expected Discharge Date:                  Expected Discharge Plan:  [place]  In-House Referral:     Discharge planning Services  CM Consult  Post Acute Care Choice:  Home Health Choice offered to:  Adult Children  DME Arranged:    DME Agency:     HH Arranged:  RN, PT [place] Agency:  [facility]  Status of Service:  In process, will continue to follow  If discussed at Long Length of Stay Meetings, dates discussed:    Additional Comments:  Jolly Mango, RN 04/18/2018, 2:07 PM

## 2018-04-19 ENCOUNTER — Observation Stay: Payer: Medicare Other

## 2018-04-19 LAB — CBC
HCT: 33.6 % — ABNORMAL LOW (ref 40.0–52.0)
Hemoglobin: 11.9 g/dL — ABNORMAL LOW (ref 13.0–18.0)
MCH: 31.6 pg (ref 26.0–34.0)
MCHC: 35.4 g/dL (ref 32.0–36.0)
MCV: 89.3 fL (ref 80.0–100.0)
PLATELETS: 331 10*3/uL (ref 150–440)
RBC: 3.77 MIL/uL — ABNORMAL LOW (ref 4.40–5.90)
RDW: 13.7 % (ref 11.5–14.5)
WBC: 7 10*3/uL (ref 3.8–10.6)

## 2018-04-19 LAB — URINALYSIS, ROUTINE W REFLEX MICROSCOPIC
BILIRUBIN URINE: NEGATIVE
GLUCOSE, UA: NEGATIVE mg/dL
HGB URINE DIPSTICK: NEGATIVE
KETONES UR: 80 mg/dL — AB
Leukocytes, UA: NEGATIVE
Nitrite: NEGATIVE
PROTEIN: NEGATIVE mg/dL
Specific Gravity, Urine: 1.014 (ref 1.005–1.030)
pH: 5 (ref 5.0–8.0)

## 2018-04-19 LAB — GLUCOSE, CAPILLARY: GLUCOSE-CAPILLARY: 128 mg/dL — AB (ref 65–99)

## 2018-04-19 MED ORDER — AMOXICILLIN-POT CLAVULANATE 875-125 MG PO TABS: 1.0000 | ORAL_TABLET | Freq: Two times a day (BID) | ORAL | 0 refills | Status: AC

## 2018-04-19 NOTE — Discharge Summary (Addendum)
[facility] [place] NAME: Zachary Serrano    MR#:  [id number]  DATE OF BIRTH:  [birth year]  DATE OF ADMISSION:  04/15/2018 ADMITTING PHYSICIAN: Saundra Shelling, MD  DATE OF DISCHARGE: 04/19/2018 11:53 AM  PRIMARY CARE PHYSICIAN: Kirk Ruths, MD    ADMISSION DIAGNOSIS:  Dehydration [E86.0] Sepsis, due to unspecified organism ([name]) [A41.9] Community acquired pneumonia, unspecified laterality [J18.9]  DISCHARGE DIAGNOSIS:  Active Problems:   Pneumonia   Pressure injury of skin   SECONDARY DIAGNOSIS:   Past Medical History:  Diagnosis Date  . A-fib ([name])   . Alzheimer disease   . Arthritis   . Cancer ([name])    skin  . Diabetes mellitus without complication ([name])   . Hypertension   . 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 Thyroid disease     HOSPITAL COURSE:   84 year old male with past medical history of Alzheimer's dementia, A. fib, diabetes, hypertension, hypothyroidism, history of squamous cell cancer skin cancer who presents to the hospital due to weakness, confusion and noted to have a pneumonia.  1.  Sepsis- patient met criteria given the patient's fever, tachycardia and chest x-ray findings suggestive of pneumonia. - Patient was treated with IV ceftriaxone, Zithromax while in the hospital and has improved and now being discharged on oral Augmentin.  Patient's blood cultures were negative.  He is now afebrile.  His urinalysis was negative.  2.  Community acquired pneumonia-this was the cause of patient's sepsis.  While in the hospital patient was treated with IV ceftriaxone, Zithromax, not being discharged on oral Augmentin.  3.  History of chronic atrial fibrillation-patient remained rate controlled.  Patient will continue his Eliquis, Metoprolol.   4.  BPH-patient will continue his Flomax, finasteride.  5.  Dementia-patient will continue his Seroquel at bedtime.  6.  Diabetes-patient  will continue his metformin.  He is being discharged home with home health services.  DISCHARGE CONDITIONS:   Stable  CONSULTS OBTAINED:    DRUG ALLERGIES:   Allergies  Allergen Reactions  . Contrast Media [Iodinated Diagnostic Agents] Other (See Comments)    Dizziness.  . Diphenhydramine     dizzy  . Fenofibrate Other (See Comments)    Anorexia, Fatigue.  Marland Kitchen Lisinopril     cough    DISCHARGE MEDICATIONS:   Allergies as of 04/19/2018      Reactions   Contrast Media [iodinated Diagnostic Agents] Other (See Comments)   Dizziness.   Diphenhydramine    dizzy   Fenofibrate Other (See Comments)   Anorexia, Fatigue.   Lisinopril    cough      Medication List    STOP taking these medications   acetaminophen 500 MG tablet Commonly known as:  TYLENOL     TAKE these medications   amoxicillin-clavulanate 875-125 MG tablet Commonly known as:  AUGMENTIN Take 1 tablet by mouth 2 (two) times daily for 14 days.   apixaban 5 MG Tabs tablet Commonly known as:  ELIQUIS Take 5 mg by mouth 2 (two) times daily.   finasteride 5 MG tablet Commonly known as:  PROSCAR Take 5 mg by mouth daily.   ketoconazole 2 % cream Commonly known as:  NIZORAL Apply 1 application topically daily as needed for irritation (Rash on leg).   levothyroxine 75 MCG tablet Commonly known as:  SYNTHROID, LEVOTHROID Take 75 mcg by mouth daily. What changed:  Another medication with the same name was removed. Continue  taking this medication, and follow the directions you see here.   magnesium oxide 400 (241.3 Mg) MG tablet Commonly known as:  MAG-OX Take 1 tablet (400 mg total) by mouth daily.   MELATONIN PO Take 1 tablet by mouth at bedtime as needed (SLEEP).   metFORMIN 500 MG tablet Commonly known as:  GLUCOPHAGE Take 250 mg by mouth 2 (two) times daily with a meal.   metoprolol tartrate 100 MG tablet Commonly known as:  LOPRESSOR Take 1 tablet (100 mg total) by mouth 2 (two) times  daily.   pantoprazole 40 MG tablet Commonly known as:  PROTONIX Take 40 mg by mouth every other day.   QUEtiapine 25 MG tablet Commonly known as:  SEROQUEL Take 0.5 tablets (12.5 mg total) by mouth at bedtime.   tamsulosin 0.4 MG Caps capsule Commonly known as:  FLOMAX Take 0.4 mg by mouth daily.         DISCHARGE INSTRUCTIONS:   DIET:  Cardiac diet and Diabetic diet  DISCHARGE CONDITION:  Stable  ACTIVITY:  Activity as tolerated  OXYGEN:  Home Oxygen: No.   Oxygen Delivery: room air  DISCHARGE LOCATION:  Home with Home health PT, RN   If you experience worsening of your admission symptoms, develop shortness of breath, life threatening emergency, suicidal or homicidal thoughts you must seek medical attention immediately by calling 911 or calling your MD immediately  if symptoms less severe.  You Must read complete instructions/literature along with all the possible adverse reactions/side effects for all the Medicines you take and that have been prescribed to you. Take any new Medicines after you have completely understood and accpet all the possible adverse reactions/side effects.   Please note  You were cared for by a hospitalist during your hospital stay. If you have any questions about your discharge medications or the care you received while you were in the hospital after you are discharged, you can call the unit and asked to speak with the hospitalist on call if the hospitalist that took care of you is not available. Once you are discharged, your primary care physician will handle any further medical issues. Please note that NO REFILLS for any discharge medications will be authorized once you are discharged, as it is imperative that you return to your primary care physician (or establish a relationship with a primary care physician if you do not have one) for your aftercare needs so that they can reassess your need for medications and monitor your lab  values.     Today   A bit lethargic this morning but perked up after breakfast.  Had a little fever but resolved with Tylenol.  Cultures remain negative.  Repeat chest x-ray showing no acute findings, urinalysis is negative.  Will discharge home on oral antibiotics with home health services.  Family is in agreement.  VITAL SIGNS:  Blood pressure 109/67, pulse 89, temperature 98.5 F (36.9 C), temperature source Oral, resp. rate (!) 25, height 5' 9"  (1.753 m), weight 81.6 kg (180 lb), SpO2 95 %.  I/O:    Intake/Output Summary (Last 24 hours) at 04/19/2018 1601 Last data filed at 04/19/2018 1017 Gross per 24 hour  Intake 240 ml  Output -  Net 240 ml    PHYSICAL EXAMINATION:  GENERAL:  84 y.o.-year-old patient lying in the bed lethargic but follows simple commands.   EYES: Pupils equal, round, reactive to light and accommodation. No scleral icterus. Extraocular muscles intact.  HEENT: Head atraumatic, normocephalic. Oropharynx and nasopharynx  clear.  NECK:  Supple, no jugular venous distention. No thyroid enlargement, no tenderness.  LUNGS: Normal breath sounds bilaterally, no wheezing, rales,rhonchi. No use of accessory muscles of respiration.  CARDIOVASCULAR: S1, S2 normal. No murmurs, rubs, or gallops.  ABDOMEN: Soft, non-tender, non-distended. Bowel sounds present. No organomegaly or mass.  EXTREMITIES: No pedal edema, cyanosis, or clubbing.  NEUROLOGIC: Cranial nerves II through XII are intact. No focal motor or sensory defecits b/l.  PSYCHIATRIC: The patient is alert and oriented x 1. SKIN: No obvious rash, lesion, or ulcer.   DATA REVIEW:   CBC Recent Labs  Lab 04/19/18 1011  WBC 7.0  HGB 11.9*  HCT 33.6*  PLT 331    Chemistries  Recent Labs  Lab 04/16/18 0543  NA 138  K 3.5  CL 106  CO2 20*  GLUCOSE 176*  BUN 11  CREATININE 0.83  CALCIUM 8.2*    Cardiac Enzymes Recent Labs  Lab 04/15/18 1018  TROPONINI 0.03*    Microbiology Results  Results for  orders placed or performed during the hospital encounter of 04/15/18  Blood Culture (routine x 2)     Status: None (Preliminary result)   Collection Time: 04/15/18 10:18 AM  Result Value Ref Range Status   Specimen Description BLOOD RAC  Final   Special Requests   Final    BOTTLES DRAWN AEROBIC AND ANAEROBIC Blood Culture adequate volume   Culture   Final    NO GROWTH 4 DAYS Performed at [facility], [address] [place], [place] [address]    Report Status PENDING  Incomplete  Blood Culture (routine x 2)     Status: None (Preliminary result)   Collection Time: 04/15/18 10:18 AM  Result Value Ref Range Status   Specimen Description BLOOD LAC  Final   Special Requests B Blood Culture adequate volume  Final   Culture   Final    NO GROWTH 4 DAYS Performed at [facility], [address], [place],  [address]    Report Status PENDING  Incomplete    RADIOLOGY:  Dg Chest Port 1 View  Result Date: 04/19/2018 CLINICAL DATA:  Fever. Atrial fibrillation. Diabetes. Hypertension. EXAM: PORTABLE CHEST 1 VIEW COMPARISON:  04/15/2018 FINDINGS: The heart size and mediastinal contours are within normal limits. Aortic atherosclerosis. Elevation of right hemidiaphragm and right basilar scarring remains stable. No evidence of acute pulmonary infiltrate or edema. No evidence of pleural effusion. IMPRESSION: Stable elevation of right hemidiaphragm and right basilar scarring. No active lung disease. Electronically Signed   By: Earle Gell M.D.   On: 04/19/2018 09:44      Management plans discussed with the patient, family and they are in agreement.  CODE STATUS:  Code Status History    Date Active Date Inactive Code Status Order ID Comments User Context   04/15/2018 1336 04/19/2018 1453 DNR [id number]  Saundra Shelling, MD Inpatient  Questions for Most Recent Historical Code Status (Order [id number])    Question Answer Comment   In the event of cardiac or respiratory ARREST Do  not call a "code blue"    In the event of cardiac or respiratory ARREST Do not perform Intubation, CPR, defibrillation or ACLS    In the event of cardiac or respiratory ARREST Use medication by any route, position, wound care, and other measures to relive pain and suffering. May use oxygen, suction and manual treatment of airway obstruction as needed for comfort.  TOTAL TIME TAKING CARE OF THIS PATIENT: 40 minutes.    Henreitta Leber M.D on 04/19/2018 at 4:01 PM  Between 7am to 6pm - Pager - [phone]  After 6pm go to [email or web address] - [employer] [place] [facility]  Office  [phone]  CC: Primary care physician; Kirk Ruths, MD

## 2018-04-20 LAB — CULTURE, BLOOD (ROUTINE X 2)
CULTURE: NO GROWTH
CULTURE: NO GROWTH
Special Requests: ADEQUATE
Special Requests: ADEQUATE

## 2018-05-02 IMAGING — CR DG LUMBAR SPINE 2-3V
3 series · 3 of 3 positions shown · non-contrast
Comparison: None.

CLINICAL DATA: 88-year-old male with chronic lumbar back pain,
prior surgery. Continued pain.

EXAM:
LUMBAR SPINE - 2-3 VIEW

[l-spine ap]
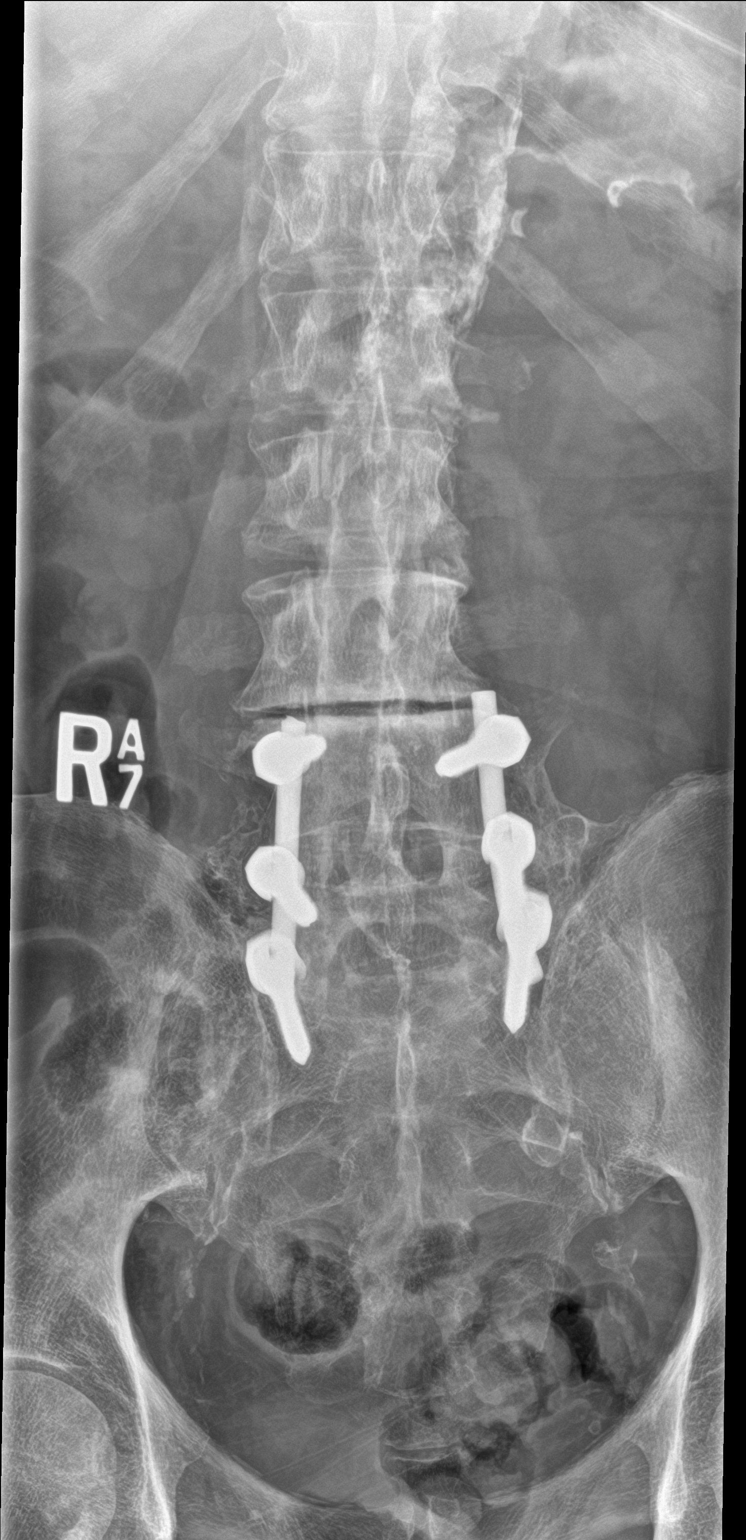

[l-spine lat]
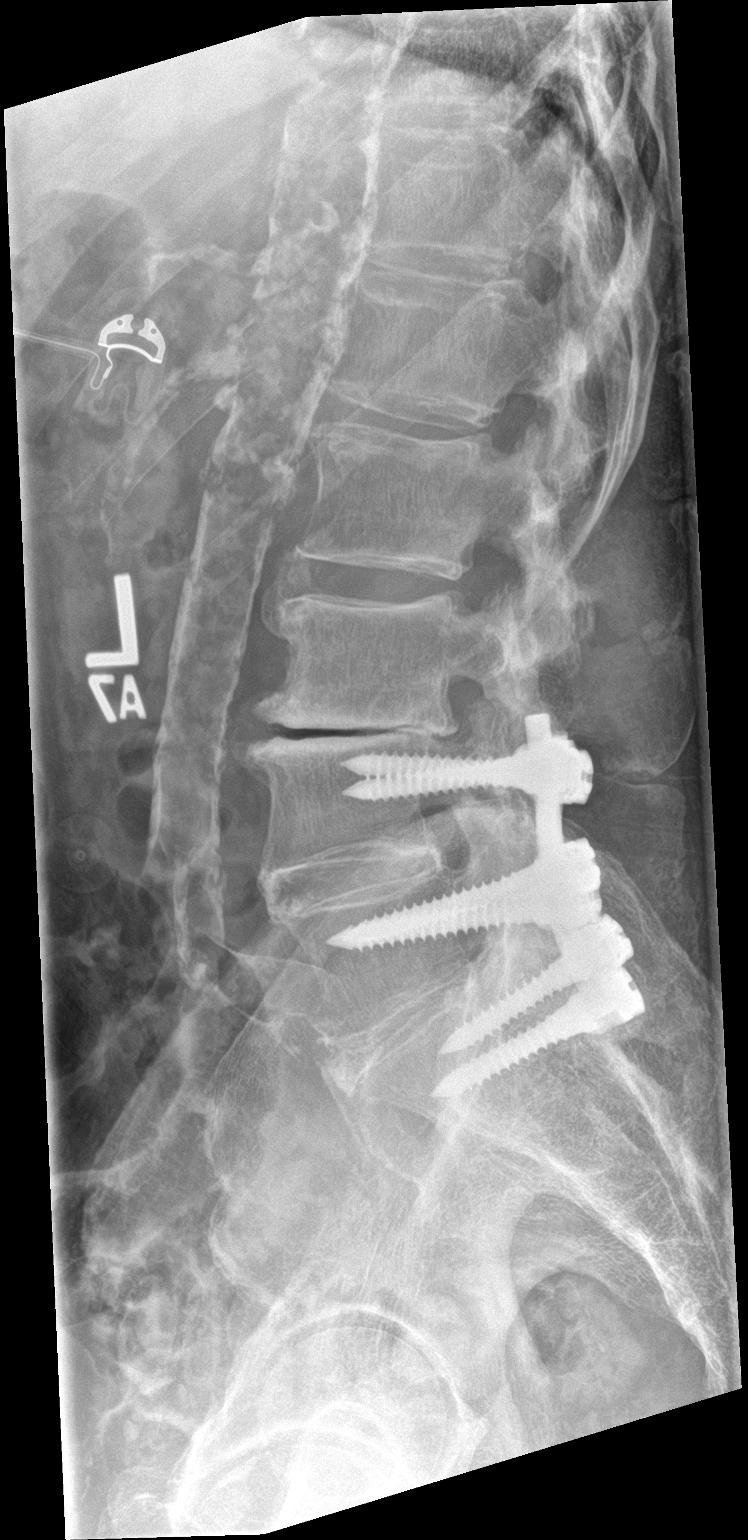

[l-spine spot]
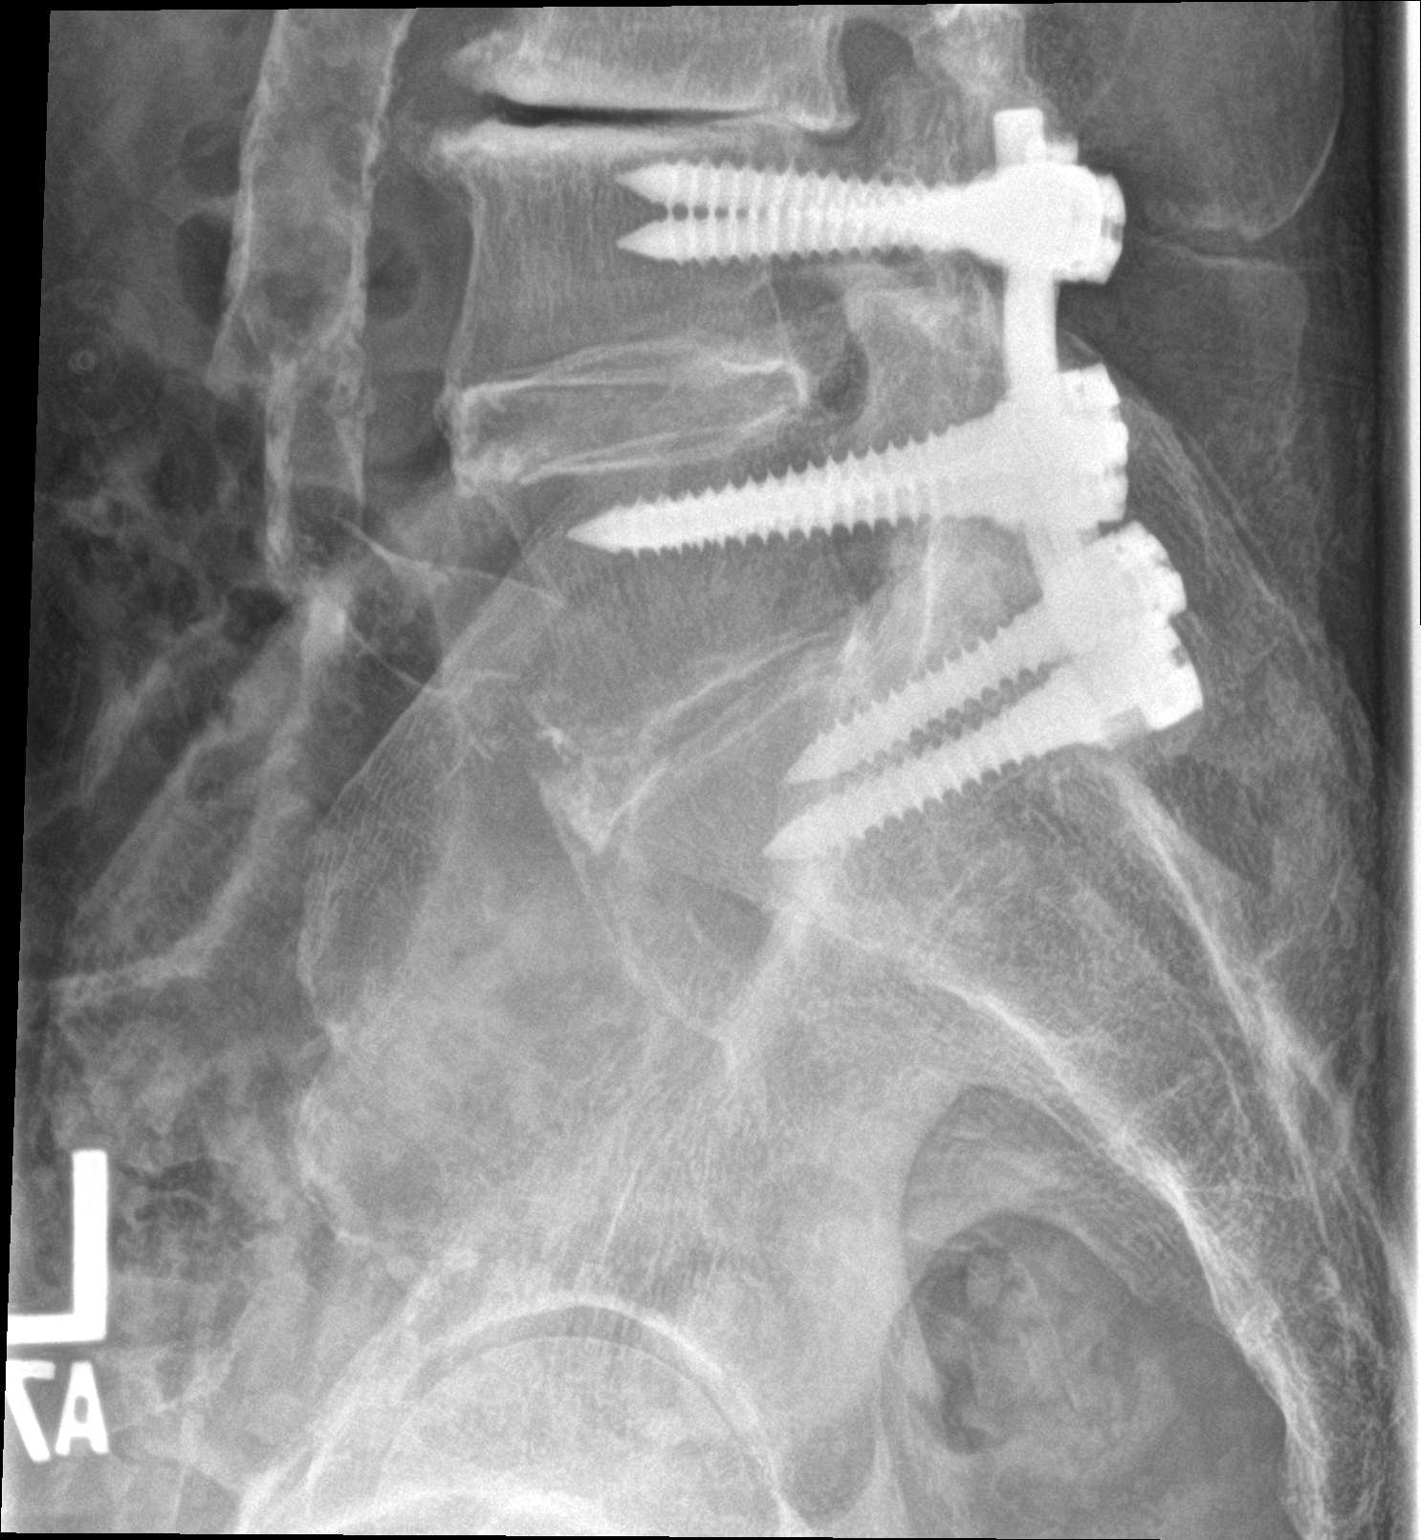

[3 of 3 positions shown; findings below may reference images not displayed]

FINDINGS: Sequelae of lower lumbar and sacral fusion. Lumbar segmentation
appears normal, with previous posterior and interbody fusion at
L4-L5 and L5-S1. Transpedicular hardware remains in place. Severe
disc space loss with vacuum disc and mild retrolisthesis of L3 on
L4. Moderate facet hypertrophy at that level. A degree of underlying
osteopenia. No acute osseous abnormality identified. Extensive
Calcified aortic atherosclerosis.
IMPRESSION: 1. Prior L4-L5 and L5-S1 fusion with solid appearing arthrodesis.
2. Severe adjacent segment disease at L3-L4 with mild retrolisthesis
and vacuum disc.
3. Extensive Calcified aortic atherosclerosis.

## 2018-05-15 DEATH — deceased
